# Patient Record
Sex: Male | Born: 1970 | Race: White | Hispanic: No | Marital: Single | Smoking: Current every day smoker
Health system: Southern US, Community
[De-identification: ages and names within clinical notes are randomized; demographics above are authoritative.]

## PROBLEM LIST (undated history)

## (undated) DIAGNOSIS — F1010 Alcohol abuse, uncomplicated: Secondary | ICD-10-CM

## (undated) DIAGNOSIS — R011 Cardiac murmur, unspecified: Secondary | ICD-10-CM

## (undated) DIAGNOSIS — F410 Panic disorder [episodic paroxysmal anxiety] without agoraphobia: Secondary | ICD-10-CM

## (undated) DIAGNOSIS — I10 Essential (primary) hypertension: Secondary | ICD-10-CM

## (undated) DIAGNOSIS — D569 Thalassemia, unspecified: Secondary | ICD-10-CM

---

## 2014-03-14 ENCOUNTER — Emergency Department (HOSPITAL_COMMUNITY)
Admission: EM | Admit: 2014-03-14 | Discharge: 2014-03-14 | Discharge status: Against Medical Advice | Payer: Self-pay | Attending: Emergency Medicine | Admitting: Emergency Medicine

## 2014-03-14 DIAGNOSIS — F1010 Alcohol abuse, uncomplicated: Secondary | ICD-10-CM | POA: Insufficient documentation

## 2014-03-14 DIAGNOSIS — Z720 Tobacco use: Secondary | ICD-10-CM | POA: Insufficient documentation

## 2014-03-14 DIAGNOSIS — I10 Essential (primary) hypertension: Secondary | ICD-10-CM | POA: Insufficient documentation

## 2014-03-14 DIAGNOSIS — R011 Cardiac murmur, unspecified: Secondary | ICD-10-CM | POA: Insufficient documentation

## 2014-03-14 NOTE — ED Notes (Signed)
Mr. Arthur ReamerShreve and friend are leaving facility at this time. Stating they will return at another time

## 2014-03-14 NOTE — ED Notes (Signed)
Per EMS pt picked up from street with request for detox from etoh.

## 2014-03-15 ENCOUNTER — Encounter (HOSPITAL_COMMUNITY): Payer: Self-pay | Admitting: Emergency Medicine

## 2014-03-15 ENCOUNTER — Encounter (HOSPITAL_COMMUNITY): Payer: Self-pay | Admitting: *Deleted

## 2014-03-15 ENCOUNTER — Emergency Department (HOSPITAL_COMMUNITY)
Admission: EM | Admit: 2014-03-15 | Discharge: 2014-03-15 | Disposition: A | Discharge status: Other Acute Inpatient Hospital | Payer: Self-pay | Attending: Emergency Medicine | Admitting: Emergency Medicine

## 2014-03-15 ENCOUNTER — Inpatient Hospital Stay (HOSPITAL_COMMUNITY)
Admission: AD | Admit: 2014-03-15 | Discharge: 2014-03-19 | DRG: 897 | Disposition: A | Discharge status: Home / Self Care | Payer: Federal, State, Local not specified - Other | Source: Intra-hospital | Attending: Psychiatry | Admitting: Psychiatry

## 2014-03-15 DIAGNOSIS — D569 Thalassemia, unspecified: Secondary | ICD-10-CM | POA: Diagnosis present

## 2014-03-15 DIAGNOSIS — Z590 Homelessness: Secondary | ICD-10-CM

## 2014-03-15 DIAGNOSIS — F17210 Nicotine dependence, cigarettes, uncomplicated: Secondary | ICD-10-CM | POA: Diagnosis present

## 2014-03-15 DIAGNOSIS — F1010 Alcohol abuse, uncomplicated: Secondary | ICD-10-CM | POA: Diagnosis present

## 2014-03-15 DIAGNOSIS — F1290 Cannabis use, unspecified, uncomplicated: Secondary | ICD-10-CM | POA: Diagnosis present

## 2014-03-15 DIAGNOSIS — I10 Essential (primary) hypertension: Secondary | ICD-10-CM | POA: Diagnosis present

## 2014-03-15 DIAGNOSIS — F1020 Alcohol dependence, uncomplicated: Secondary | ICD-10-CM

## 2014-03-15 DIAGNOSIS — F10232 Alcohol dependence with withdrawal with perceptual disturbance: Secondary | ICD-10-CM

## 2014-03-15 DIAGNOSIS — F10932 Alcohol use, unspecified with withdrawal with perceptual disturbance: Secondary | ICD-10-CM

## 2014-03-15 DIAGNOSIS — Z720 Tobacco use: Secondary | ICD-10-CM | POA: Insufficient documentation

## 2014-03-15 DIAGNOSIS — G4700 Insomnia, unspecified: Secondary | ICD-10-CM | POA: Diagnosis present

## 2014-03-15 DIAGNOSIS — Z9114 Patient's other noncompliance with medication regimen: Secondary | ICD-10-CM | POA: Diagnosis present

## 2014-03-15 DIAGNOSIS — F10239 Alcohol dependence with withdrawal, unspecified: Secondary | ICD-10-CM | POA: Diagnosis present

## 2014-03-15 DIAGNOSIS — F4001 Agoraphobia with panic disorder: Secondary | ICD-10-CM | POA: Diagnosis present

## 2014-03-15 DIAGNOSIS — F10929 Alcohol use, unspecified with intoxication, unspecified: Secondary | ICD-10-CM

## 2014-03-15 DIAGNOSIS — F19239 Other psychoactive substance dependence with withdrawal, unspecified: Secondary | ICD-10-CM

## 2014-03-15 DIAGNOSIS — F19980 Other psychoactive substance use, unspecified with psychoactive substance-induced anxiety disorder: Secondary | ICD-10-CM

## 2014-03-15 DIAGNOSIS — F10230 Alcohol dependence with withdrawal, uncomplicated: Secondary | ICD-10-CM

## 2014-03-15 DIAGNOSIS — F329 Major depressive disorder, single episode, unspecified: Secondary | ICD-10-CM | POA: Insufficient documentation

## 2014-03-15 DIAGNOSIS — F19280 Other psychoactive substance dependence with psychoactive substance-induced anxiety disorder: Secondary | ICD-10-CM

## 2014-03-15 DIAGNOSIS — F10220 Alcohol dependence with intoxication, uncomplicated: Secondary | ICD-10-CM | POA: Insufficient documentation

## 2014-03-15 HISTORY — DX: Cardiac murmur, unspecified: R01.1

## 2014-03-15 HISTORY — DX: Thalassemia, unspecified: D56.9

## 2014-03-15 HISTORY — DX: Panic disorder (episodic paroxysmal anxiety): F41.0

## 2014-03-15 HISTORY — DX: Essential (primary) hypertension: I10

## 2014-03-15 HISTORY — DX: Alcohol abuse, uncomplicated: F10.10

## 2014-03-15 LAB — CBC WITH DIFFERENTIAL/PLATELET
BASOS PCT: 1 % (ref 0–1)
Basophils Absolute: 0.1 10*3/uL (ref 0.0–0.1)
EOS PCT: 1 % (ref 0–5)
Eosinophils Absolute: 0.1 10*3/uL (ref 0.0–0.7)
HCT: 29.9 % — ABNORMAL LOW (ref 39.0–52.0)
HEMOGLOBIN: 10.1 g/dL — AB (ref 13.0–17.0)
Lymphocytes Relative: 34 % (ref 12–46)
Lymphs Abs: 2.6 10*3/uL (ref 0.7–4.0)
MCH: 23.3 pg — ABNORMAL LOW (ref 26.0–34.0)
MCHC: 33.8 g/dL (ref 30.0–36.0)
MCV: 68.9 fL — ABNORMAL LOW (ref 78.0–100.0)
MONOS PCT: 9 % (ref 3–12)
Monocytes Absolute: 0.7 10*3/uL (ref 0.1–1.0)
NEUTROS PCT: 55 % (ref 43–77)
Neutro Abs: 4.1 10*3/uL (ref 1.7–7.7)
Platelets: 148 10*3/uL — ABNORMAL LOW (ref 150–400)
RBC: 4.34 MIL/uL (ref 4.22–5.81)
RDW: 16.9 % — ABNORMAL HIGH (ref 11.5–15.5)
SMEAR REVIEW: ADEQUATE
WBC: 7.6 10*3/uL (ref 4.0–10.5)

## 2014-03-15 LAB — RAPID URINE DRUG SCREEN, HOSP PERFORMED
Amphetamines: NOT DETECTED
BENZODIAZEPINES: NOT DETECTED
Barbiturates: NOT DETECTED
COCAINE: NOT DETECTED
OPIATES: NOT DETECTED
Tetrahydrocannabinol: POSITIVE — AB

## 2014-03-15 LAB — COMPREHENSIVE METABOLIC PANEL
ALBUMIN: 4.1 g/dL (ref 3.5–5.2)
ALT: 34 U/L (ref 0–53)
ALT: 35 U/L (ref 0–53)
ANION GAP: 18 — AB (ref 5–15)
AST: 66 U/L — ABNORMAL HIGH (ref 0–37)
AST: 62 U/L — AB (ref 0–37)
Albumin: 4.4 g/dL (ref 3.5–5.2)
Alkaline Phosphatase: 73 U/L (ref 39–117)
Alkaline Phosphatase: 80 U/L (ref 39–117)
Anion gap: 13 (ref 5–15)
BILIRUBIN TOTAL: 2.6 mg/dL — AB (ref 0.3–1.2)
BUN: 5 mg/dL — ABNORMAL LOW (ref 6–23)
BUN: 5 mg/dL — ABNORMAL LOW (ref 6–23)
CALCIUM: 9.8 mg/dL (ref 8.4–10.5)
CO2: 23 mEq/L (ref 19–32)
CO2: 27 mEq/L (ref 19–32)
CREATININE: 0.59 mg/dL (ref 0.50–1.35)
CREATININE: 0.59 mg/dL (ref 0.50–1.35)
Calcium: 8.7 mg/dL (ref 8.4–10.5)
Chloride: 100 mEq/L (ref 96–112)
Chloride: 97 mEq/L (ref 96–112)
GFR calc Af Amer: >90 mL/min (ref >90)
GFR calc Af Amer: >90 mL/min (ref >90)
GFR calc non Af Amer: >90 mL/min (ref >90)
GFR calc non Af Amer: >90 mL/min (ref >90)
Glucose, Bld: 94 mg/dL (ref 70–99)
Glucose, Bld: 107 mg/dL — ABNORMAL HIGH (ref 70–99)
Potassium: 3.7 mEq/L (ref 3.7–5.3)
Potassium: 4.3 mEq/L (ref 3.7–5.3)
Sodium: 141 mEq/L (ref 137–147)
Sodium: 137 mEq/L (ref 137–147)
TOTAL PROTEIN: 7.5 g/dL (ref 6.0–8.3)
Total Bilirubin: 1.3 mg/dL — ABNORMAL HIGH (ref 0.3–1.2)
Total Protein: 8 g/dL (ref 6.0–8.3)

## 2014-03-15 LAB — ETHANOL: Alcohol, Ethyl (B): 334 mg/dL — ABNORMAL HIGH (ref 0–11)

## 2014-03-15 MED ORDER — ALUM & MAG HYDROXIDE-SIMETH 200-200-20 MG/5ML PO SUSP: 30.0000 mL | ORAL | Status: DC | PRN

## 2014-03-15 MED ORDER — LORAZEPAM 1 MG PO TABS
1.0000 mg | ORAL_TABLET | Freq: Four times a day (QID) | ORAL | Status: AC
  Administered 2014-03-15 – 2014-03-16 (×4): 1 mg via ORAL
  Filled 2014-03-15 (×4): qty 1

## 2014-03-15 MED ORDER — LORAZEPAM 1 MG PO TABS
1.0000 mg | ORAL_TABLET | Freq: Two times a day (BID) | ORAL | Status: AC
  Administered 2014-03-17 – 2014-03-18 (×2): 1 mg via ORAL
  Filled 2014-03-15 (×2): qty 1

## 2014-03-15 MED ORDER — IBUPROFEN 400 MG PO TABS: 600.0000 mg | ORAL_TABLET | Freq: Three times a day (TID) | ORAL | Status: DC | PRN

## 2014-03-15 MED ORDER — VITAMIN B-1 100 MG PO TABS
100.0000 mg | ORAL_TABLET | Freq: Every day | ORAL | Status: DC
  Administered 2014-03-15: 100 mg via ORAL
  Filled 2014-03-15: qty 1

## 2014-03-15 MED ORDER — ADULT MULTIVITAMIN W/MINERALS CH
1.0000 | ORAL_TABLET | Freq: Every day | ORAL | Status: DC
  Administered 2014-03-15 – 2014-03-19 (×5): 1 via ORAL
  Filled 2014-03-15 (×9): qty 1

## 2014-03-15 MED ORDER — FOLIC ACID 1 MG PO TABS
1.0000 mg | ORAL_TABLET | Freq: Every day | ORAL | Status: DC
  Administered 2014-03-15 – 2014-03-19 (×5): 1 mg via ORAL
  Filled 2014-03-15 (×8): qty 1

## 2014-03-15 MED ORDER — LORAZEPAM 1 MG PO TABS
1.0000 mg | ORAL_TABLET | Freq: Four times a day (QID) | ORAL | Status: AC | PRN
  Administered 2014-03-16 – 2014-03-17 (×2): 1 mg via ORAL
  Filled 2014-03-15 (×2): qty 1

## 2014-03-15 MED ORDER — LORAZEPAM 1 MG PO TABS: 0.0000 mg | ORAL_TABLET | Freq: Four times a day (QID) | ORAL | Status: DC

## 2014-03-15 MED ORDER — ONDANSETRON HCL 4 MG PO TABS
4.0000 mg | ORAL_TABLET | Freq: Three times a day (TID) | ORAL | Status: DC | PRN
  Administered 2014-03-15: 4 mg via ORAL
  Filled 2014-03-15: qty 1

## 2014-03-15 MED ORDER — ACETAMINOPHEN 325 MG PO TABS: 650.0000 mg | ORAL_TABLET | ORAL | Status: DC | PRN

## 2014-03-15 MED ORDER — NICOTINE 21 MG/24HR TD PT24
21.0000 mg | MEDICATED_PATCH | Freq: Every day | TRANSDERMAL | Status: DC
  Administered 2014-03-15: 21 mg via TRANSDERMAL
  Filled 2014-03-15: qty 1

## 2014-03-15 MED ORDER — MAGNESIUM HYDROXIDE 400 MG/5ML PO SUSP: 30.0000 mL | Freq: Every day | ORAL | Status: DC | PRN

## 2014-03-15 MED ORDER — THIAMINE HCL 100 MG/ML IJ SOLN
100.0000 mg | Freq: Once | INTRAMUSCULAR | Status: AC
  Administered 2014-03-15: 100 mg via INTRAMUSCULAR
  Filled 2014-03-15: qty 2

## 2014-03-15 MED ORDER — TRAZODONE HCL 50 MG PO TABS
50.0000 mg | ORAL_TABLET | Freq: Every evening | ORAL | Status: DC | PRN
  Administered 2014-03-16 – 2014-03-18 (×3): 50 mg via ORAL
  Filled 2014-03-15: qty 14
  Filled 2014-03-15 (×2): qty 1

## 2014-03-15 MED ORDER — LOPERAMIDE HCL 2 MG PO CAPS: 2.0000 mg | ORAL_CAPSULE | ORAL | Status: AC | PRN

## 2014-03-15 MED ORDER — THIAMINE HCL 100 MG/ML IJ SOLN: 100.0000 mg | Freq: Every day | INTRAMUSCULAR | Status: DC

## 2014-03-15 MED ORDER — NICOTINE 21 MG/24HR TD PT24
21.0000 mg | MEDICATED_PATCH | Freq: Every day | TRANSDERMAL | Status: DC
  Administered 2014-03-16 – 2014-03-19 (×4): 21 mg via TRANSDERMAL
  Filled 2014-03-15 (×7): qty 1

## 2014-03-15 MED ORDER — HYDROXYZINE HCL 25 MG PO TABS
25.0000 mg | ORAL_TABLET | Freq: Four times a day (QID) | ORAL | Status: AC | PRN
  Administered 2014-03-15: 25 mg via ORAL
  Filled 2014-03-15: qty 1

## 2014-03-15 MED ORDER — DICLOFENAC SODIUM 1 % TD GEL
4.0000 g | Freq: Four times a day (QID) | TRANSDERMAL | Status: DC
  Administered 2014-03-15 – 2014-03-18 (×8): 4 g via TOPICAL
  Filled 2014-03-15: qty 100

## 2014-03-15 MED ORDER — ENSURE COMPLETE PO LIQD
237.0000 mL | Freq: Two times a day (BID) | ORAL | Status: DC
  Administered 2014-03-16 – 2014-03-19 (×6): 237 mL via ORAL

## 2014-03-15 MED ORDER — SERTRALINE HCL 25 MG PO TABS
25.0000 mg | ORAL_TABLET | Freq: Every day | ORAL | Status: DC
  Administered 2014-03-15 – 2014-03-16 (×2): 25 mg via ORAL
  Filled 2014-03-15 (×5): qty 1

## 2014-03-15 MED ORDER — LORAZEPAM 1 MG PO TABS
1.0000 mg | ORAL_TABLET | Freq: Three times a day (TID) | ORAL | Status: AC
  Administered 2014-03-16 – 2014-03-17 (×3): 1 mg via ORAL
  Filled 2014-03-15 (×3): qty 1

## 2014-03-15 MED ORDER — LORAZEPAM 1 MG PO TABS: 0.0000 mg | ORAL_TABLET | Freq: Two times a day (BID) | ORAL | Status: DC

## 2014-03-15 MED ORDER — VITAMIN B-1 100 MG PO TABS
100.0000 mg | ORAL_TABLET | Freq: Every day | ORAL | Status: DC
  Administered 2014-03-16 – 2014-03-19 (×4): 100 mg via ORAL
  Filled 2014-03-15 (×7): qty 1

## 2014-03-15 MED ORDER — LORAZEPAM 1 MG PO TABS
1.0000 mg | ORAL_TABLET | Freq: Once | ORAL | Status: AC
  Administered 2014-03-15: 1 mg via ORAL
  Filled 2014-03-15: qty 1

## 2014-03-15 MED ORDER — LORAZEPAM 1 MG PO TABS
1.0000 mg | ORAL_TABLET | Freq: Every day | ORAL | Status: AC
  Administered 2014-03-19: 1 mg via ORAL
  Filled 2014-03-15: qty 1

## 2014-03-15 MED ORDER — ACETAMINOPHEN 325 MG PO TABS: 650.0000 mg | ORAL_TABLET | Freq: Four times a day (QID) | ORAL | Status: DC | PRN

## 2014-03-15 NOTE — [facility] Group Notes (Signed)
[facility] Group Notes:  (Counselor/Nursing/MHT/Case Management/Adjunct)  03/15/2014 1:15PM  Type of Therapy:  Group Therapy  Participation Level:  Did not attend  Summary of Progress/Problems: The topic for group was balance in life.  Pt participated in the discussion about when their life was in balance and out of balance and how this feels.  Pt discussed ways to get back in balance and short term goals they can work on to get where they want to be.    Daryel Geraldorth, Shary Lamos B 03/15/2014 2:11 PM

## 2014-03-15 NOTE — ED Notes (Signed)
Severe panic attacks.  Requesting detox from ETOH  "I have been drinking for 30 years and need help"
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
## 2014-03-15 NOTE — Progress Notes (Signed)
Psychoeducational Group Note  Date:  03/15/2014 Time:  2100  Group Topic/Focus:  wrap up group  Participation Level: Did Not Attend  Participation Quality:  Not Applicable  Affect:  Not Applicable  Cognitive:  Not Applicable  Insight:  Not Applicable  Engagement in Group: Not Applicable  Additional Comments: Pt was notified that group was beginning but remained in his bed sleeping. Pt was a new admit today.   Shelah LewandowskySquires, Donyell Carrell Carol 03/15/2014, 11:26 PM
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
## 2014-03-15 NOTE — ED Notes (Signed)
Telepsych in process 

## 2014-03-15 NOTE — ED Provider Notes (Signed)
CSN: [id number]     Arrival date & time 03/15/14  0008 History   First MD Initiated Contact with Patient 03/15/14 0139     Chief Complaint  Patient presents with  . Detox from ETOH      (Consider location/radiation/quality/duration/timing/severity/associated sxs/prior Treatment) HPI 43 year old male presents to emergency requesting alcohol detox.  He reports it has been 5 years since his last.  Sobriety.  Patient reports that when he does not drink alcohol, he had 3 shaky.  He reports history of DTs in the past.  Patient reports he drinks a fifth of liquor and as much beer as he and his hands on.  Patient reports history of PTSD, schizophrenia, depression.  He is not currently on any psychotropic medications.  He does not have a psychiatrist or therapist.  Patient denies any other drug abuse.  Patient smokes about a pack a day.  Patient is currently homeless.  No prior visits in our hospital system. Past Medical History  Diagnosis Date  . ETOH abuse   . Panic attack    History reviewed. No pertinent past surgical history. No family history on file. History  Substance Use Topics  . Smoking status: Current Every Day Smoker  . Smokeless tobacco: Never Used  . Alcohol Use: Yes    Review of Systems  Level V caveat patient is intoxicated, and is having difficulty answering questions  Allergies  Review of patient's allergies indicates no known allergies.  Home Medications   Prior to Admission medications   Not on File   BP 120/65  Pulse 95  Temp(Src) 98.6 F (37 C) (Oral)  Resp 16  Ht 5\' 11"  (1.803 m)  Wt 145 lb (65.772 kg)  BMI 20.23 kg/m2  SpO2 98% Physical Exam  Nursing note and vitals reviewed. Constitutional: He is oriented to person, place, and time. He appears well-developed and well-nourished.  HENT:  Head: Normocephalic and atraumatic.  Nose: Nose normal.  Mouth/Throat: Oropharynx is clear and moist.  Eyes: Conjunctivae and EOM are normal. Pupils are equal,  round, and reactive to light.  Neck: Normal range of motion. Neck supple. No JVD present. No tracheal deviation present. No thyromegaly present.  Cardiovascular: Normal rate, regular rhythm, normal heart sounds and intact distal pulses.  Exam reveals no gallop and no friction rub.   No murmur heard. Pulmonary/Chest: Effort normal and breath sounds normal. No stridor. No respiratory distress. He has no wheezes. He has no rales. He exhibits no tenderness.  Abdominal: Soft. Bowel sounds are normal. He exhibits no distension and no mass. There is no tenderness. There is no rebound and no guarding.  Musculoskeletal: Normal range of motion. He exhibits no edema and no tenderness.  Lymphadenopathy:    He has no cervical adenopathy.  Neurological: He is alert and oriented to person, place, and time. He displays normal reflexes. He exhibits normal muscle tone. Coordination normal.  Skin: Skin is warm and dry. No rash noted. No erythema. No pallor.  Psychiatric: His behavior is normal. Judgment and thought content normal.  Easily startled, reports depressed mood    ED Course  Procedures (including critical care time) Labs Review Labs Reviewed  CBC WITH DIFFERENTIAL - Abnormal; Notable for the following:    Hemoglobin 10.1 (*)    HCT 29.9 (*)    MCV 68.9 (*)    MCH 23.3 (*)    RDW 16.9 (*)    Platelets 148 (*)    All other components within normal limits  COMPREHENSIVE METABOLIC  PANEL  URINE RAPID DRUG SCREEN (HOSP PERFORMED)  ETHANOL    Imaging Review No results found.   EKG Interpretation None      MDM   Final diagnoses:  Alcohol dependence with uncomplicated intoxication   43 yo male with alcohol dependence, prior history of severe alcohol withdrawal.  Patient also reports history of psychiatric diagnosis as well.  We'll get labs, treat for any withdrawal symptoms.  We'll get TTS consult for possible placement.  5:27 AM Labs not crossing over:  ETOH 334 Na 141, K 3.7, Cl 100,  CO2 23, Glu 94, Bun 5, Cr 0.59, Ca 8.7, Total Protein 7.5, Albumin 4.1, AST 66, ALT 34, AP 73, Bili 1.3  Pt is medically clear, but clearly intoxicated.  Would not expect withdrawal symptoms for a few hours.  Olivia Mackielga M Roshon Duell, MD 03/15/14 0530

## 2014-03-15 NOTE — ED Notes (Signed)
[name] TO GO [facility] AND SEND PATIENT OVER

## 2014-03-15 NOTE — Progress Notes (Signed)
performed by [facility]

## 2014-03-15 NOTE — BH Assessment (Signed)
Requested TA equipment be placed with pt for assessment.   Attempted to speak with EDP prior to assessment, and she was currently unavailable. In order to avoid delay in pt care initiating assessment and will follow up with EDP following.    Assessment to commence shortly.   Arthur BernhardtNancy Charleton Deyoung, [facility] Triage Specialist 03/15/2014 3:35 AM

## 2014-03-15 NOTE — ED Notes (Signed)
[facility] HAS ARRIVED TO TRANSPORT [facility]

## 2014-03-15 NOTE — BH Assessment (Signed)
Relayed results of assessment to Donell SievertSpencer Simon, PA. Per Karleen HampshireSpencer PA, pt meets inpt criteria and can be accepted to a 500 hall bed, pt must wait until after 11 am to arrive due to BAL.   Spoke with Bunnie Pionori AC to get bed assignment. Pt will be going to a 500 hall bed ([facility] under the care of Dr. Elna BreslowEappen. Pt to arrive after 11 am report can be called to [phone].   In[facility] Norlene Campbelltter [facility]. She is in agreement with plan.     Informed [facility] She will obtain support paperwork, fax to [phone] and send originals with pt when transported via [facility].   Informed registration tech of pt acceptance.   Clista BernhardtNancy Kathern Lobosco, [facility] Triage Specialist 03/15/2014 6:11 AM

## 2014-03-15 NOTE — BHH Suicide Risk Assessment (Signed)
   Nursing information obtained from:    Demographic factors:    Current Mental Status:    Loss Factors:    Historical Factors:    Risk Reduction Factors:    Total Time spent with patient: 45 minutes  CLINICAL FACTORS:   Alcohol/Substance Abuse/Dependencies  Psychiatric Specialty Exam: Physical Exam  ROS  Blood pressure 128/66, pulse 77.There is no weight on file to calculate BMI.  Please see H&P for MSE.    SUICIDE RISK:   Mild:  Suicidal ideation of limited frequency, intensity, duration, and specificity.  There are no identifiable plans, no associated intent, mild dysphoria and related symptoms, good self-control (both objective and subjective assessment), few other risk factors, and identifiable protective factors, including available and accessible social support.  PLAN OF CARE:Please see H&P.   I certify that inpatient services furnished can reasonably be expected to improve the patient's condition.  Jawara Latorre md 03/15/2014, 2:08 PM

## 2014-03-15 NOTE — ED Notes (Signed)
[facility] CALLED TO TRANSPORT PATIENT TO [facility] 

## 2014-03-15 NOTE — Progress Notes (Signed)
performed by [facility] 

## 2014-03-16 ENCOUNTER — Other Ambulatory Visit: Payer: Self-pay

## 2014-03-16 DIAGNOSIS — F10239 Alcohol dependence with withdrawal, unspecified: Principal | ICD-10-CM

## 2014-03-16 LAB — TSH: TSH: 1.16 u[IU]/mL (ref 0.350–4.500)

## 2014-03-16 MED ORDER — ESCITALOPRAM OXALATE 10 MG PO TABS
10.0000 mg | ORAL_TABLET | Freq: Every day | ORAL | Status: DC
  Administered 2014-03-17 – 2014-03-19 (×3): 10 mg via ORAL
  Filled 2014-03-16 (×2): qty 1
  Filled 2014-03-16: qty 14
  Filled 2014-03-16 (×3): qty 1

## 2014-03-16 MED ORDER — ONDANSETRON HCL 4 MG PO TABS
4.0000 mg | ORAL_TABLET | Freq: Three times a day (TID) | ORAL | Status: DC | PRN
  Administered 2014-03-18: 4 mg via ORAL
  Filled 2014-03-16: qty 1

## 2014-03-16 NOTE — Progress Notes (Signed)
D. Pt has been in his room this evening, minimal participation in milieu. Pt is tremulous and actively withdrawing. Pt has endorsed anxiety and sweats. Pt did receive medication as well as fluids to help with hydration.  A. Support and encouragement provided. R. Safety maintained, will continue to monitor.

## 2014-03-16 NOTE — Progress Notes (Signed)
NUTRITION ASSESSMENT NOTE  RD consult received for nutrition education  INTERVENTION: 1. Educated patient on the importance of nutrition and encouraged intake of food and beverages. 2. Discussed weight goals. 3. Supplements: Ensure Complete po BID, each supplement provides 350 kcal and 13 grams of protein   NUTRITION DIAGNOSIS: Inadequate oral intake related to alcohol withdrawal as evidenced by pt report   Goal: Pt to meet >/= 90% of their estimated nutrition needs.  Monitor:  PO intake  Assessment:  43 year old man , who reports a long and protracted history of alcohol dependence. He has been drinking every day, " for a long time". He states he drinks beer , sometimes liquor, and " all day long". He can drink up to a case of beer per day.  During visit pt reports feeling nauseous. He is unable to provide much history at this time and is not very receptive to education.  Pt did state that his appetite was fine PTA but since withdrawal he has no appetite.  Pt is at nutritional risk d/t Hx of alcohol abuse and living situation (homeless).   Continue to provide Ensure supplement to provide extra calories and protein for help in recovery.    Labs reviewed: Low BUN  Height: Ht Readings from Last 1 Encounters:  03/15/14 5' 8.9" (1.75 m)    Weight: Wt Readings from Last 1 Encounters:  03/15/14 141 lb (63.957 kg)    Weight Hx: Wt Readings from Last 10 Encounters:  03/15/14 141 lb (63.957 kg)  03/15/14 145 lb (65.772 kg)    BMI:  Body mass index is 20.88 kg/(m^2). Pt meets criteria for normal range based on current BMI.  Estimated Nutritional Needs: Kcal: 30-35 kcal/kg Protein: > 1 gram protein/kg Fluid: 1 ml/kcal  Diet Order: General Pt is also offered choice of unit snacks mid-morning and mid-afternoon.  Pt is eating as desired.   Lab results and medications reviewed.   Tilda FrancoLindsey Jerico Grisso, MS, RD, LDN Pager: [phone] After Hours Pager: [phone]

## 2014-03-16 NOTE — Progress Notes (Signed)
D) Pt has been lying in the bed much of the shift stating "I don't feel worth anything". States, "I am detoxing and I don't want to do anything". Affect is flat and mood depressed. Pt. States that he always has a rough time with his detox. Did ask for towels, and items for bathing. All meals brought to Pt along with pushing fluids. Pt rates his depression at a 9, hopelessness at an 8 and his anxiety at a 10. Denies SI and HI. A) Given support, reassurance and praise. Provided with a 1:1. Provided with fluids throughout the shift to hydrate Pt. Provided with toiletries. R) Denies SI and HI.

## 2014-03-16 NOTE — BHH Suicide Risk Assessment (Signed)
   Nursing information obtained from:    Demographic factors:   43 year old single male Current Mental Status:   See below Loss Factors:   homelessness, poor social support system Historical Factors:   long history of alcohol dependence Risk Reduction Factors:   resilience  Total Time spent with patient: 45 minutes  CLINICAL FACTORS:  Alcohol dependence, panic disorder  Psychiatric Specialty Exam: Physical Exam  ROS  Blood pressure 124/80, pulse 95, temperature 97.6 F (36.4 C), temperature source Oral, resp. rate 16, height 5' 8.9" (1.75 m), weight 63.957 kg (141 lb).Body mass index is 20.88 kg/(m^2).  SEE ADMIT NOTE MSE    COGNITIVE FEATURES THAT CONTRIBUTE TO RISK:  Closed-mindedness    SUICIDE RISK:   Moderate:  Frequent suicidal ideation with limited intensity, and duration, some specificity in terms of plans, no associated intent, good self-control, limited dysphoria/symptomatology, some risk factors present, and identifiable protective factors, including available and accessible social support.  PLAN OF CARE: Patient will be admitted to inpatient psychiatric unit for stabilization and safety. Will provide and encourage milieu participation. Provide medication management and maked adjustments as needed.   Will also provide medication management to minimize risk of severe ETOH withdrawal. Will follow daily.    I certify that inpatient services furnished can reasonably be expected to improve the patient's condition.  COBOS, FERNANDO 03/16/2014, 12:12 PM

## 2014-03-16 NOTE — Clinical Social Work Note (Addendum)
CSW attempted to meet with patient to complete PSA.  Patient advised of not feeling well and asked CSW to come back later.  CSW attempted to meet with patient again to complete PSA.  He got out of bed but stated he did not feel up to answering a lot of questions.

## 2014-03-16 NOTE — BHH Group Notes (Signed)
Adult Psychoeducational Group Note  Date:  03/16/2014 Time:  11:16 PM  Group Topic/Focus:  AA Meeting  Participation Level:  Did Not Attend  Participation Quality:  None  Affect:  None  Cognitive:  None  Insight: None  Engagement in Group:  None  Modes of Intervention:  Discussion and Education  Additional Comments:  Arthur GermanyJohn [name] did not attend group.  Arthur RancherLindsay, Arthur [name] A 03/16/2014, 11:16 PM

## 2014-03-16 NOTE — [facility] Group Notes (Signed)
[facility] Note   03/16/2014 11:57 AM  Participation Quality:  Did not attend group.  Jennette Leask, Joesph JulyQuylle Hairston

## 2014-03-16 NOTE — Tx Team (Signed)
Interdisciplinary Treatment Plan Update   Date Reviewed:  03/16/2014  Time Reviewed:  9:47 AM  Progress in Treatment:   Attending groups: Yes Participating in groups: Yes Taking medication as prescribed: Yes  Tolerating medication: Yes Family/Significant other contact made:  No, but will ask patient for consent for collateral contact Patient understands diagnosis: Yes, patient understands diagnosis and need for treatment. Discussing patient identified problems/goals with staff: Yes, patient is able to express goals for treatment and discharge. Medical problems stabilized or resolved: Yes Denies suicidal/homicidal ideation: Yes Patient has not harmed self or others: Yes  For review of initial/current patient goals, please see plan of care.  Estimated Length of Stay:  3-5 days  Reasons for Continued Hospitalization:  Anxiety Depression Medication stabilization  New Problems/Goals identified:    Discharge Plan or Barriers:   Home with outpatient follow up to be determined  Additional Comments:   Arthur [name] is an 43 y.o. Caucasian male who is divorced ,unemployed and [place] presented to ED requesting assistance with his drinking and self-reported panic disorder. Pt reports he has been off of his medications for more than 6 years. Pt reports he has started on Lexapro sometime in the past when [place] in [place] and this was for depression and anxiety sx. Pt also reports hearing voices since a long time ,unsure if this is related to drinking ,since he drinks all the time and very heavily. Pt reports the voices as "music and whispers."  Pt reports anxiety issues since the age of 89 y. He reports having a heart condition and having a "murmur" and hence had palpitations all the time which made him panic. Pt started drinking at the age of 43 y.  Pt drinks regularly ,a 5 th of vodka and 5-6 beers everyday.    Patient and CSW reviewed  patient's identified goals and treatment plan.  Patient verbalized understanding and agreed to treatment plan.   Attendees:  Patient:  03/16/2014 9:47 AM   Signature:  Sallyanne HaversF. Cobos, MD 03/16/2014 9:47 AM  Signature: Geoffery LyonsIrving Lugo, MD 03/16/2014 9:47 AM  Signature:  Genelle GatherPatti Dukes, RN 03/16/2014 9:47 AM  Signature:  Robbie LouisVivian Kent, RN 03/16/2014 9:47 AM  Signature:  Lamount Crankerhris Judge, RN 03/16/2014 9:47 AM  Signature:  Juline PatchQuylle Dushaun Okey, LCSW 03/16/2014 9:47 AM  Signature:  Belenda CruiseKristin Drinkard, LCSW-A 03/16/2014 9:47 AM  Signature:  Leisa LenzValerie Enoch, Care Coordinator [facility] 03/16/2014 9:47 AM  Signature:  03/16/2014 9:47 AM  Signature: 03/16/2014  9:47 AM  Signature:   , RN URCM 03/16/2014  9:47 AM  Signature:  Santa GeneraAnne Cunningham Lead Social Worker LCSW 03/16/2014  9:47 AM    Scribe for Treatment Team:   [place]hes[place] EnergyQuylle Jayven Naill,  03/16/2014 9:47 AM

## 2014-03-16 NOTE — B[facility] Group Notes (Signed)
[facility] LCSW Group Therapy  Feelings Around Relapse 1:15 -2:30        03/16/2014 3:17 PM   Type of Therapy:  Group Therapy  Participation Level:  Did not attend group - patient in bed.   Wynn BankerHodnett, Estera Ozier Hairston 03/16/2014 3:17 PM
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
## 2014-03-16 NOTE — Progress Notes (Signed)
[facility] Group Notes:  (Nursing/MHT/Case Management/Adjunct)  Date:  03/16/2014  Time:  1:05 PM  Type of Therapy:  Therapeutic Activity  Participation Level:  Did not attend. Pt was in bed asleep.   Arthur [name] 03/16/2014, 1:05 PM 

## 2014-03-17 DIAGNOSIS — F1020 Alcohol dependence, uncomplicated: Secondary | ICD-10-CM

## 2014-03-17 DIAGNOSIS — F419 Anxiety disorder, unspecified: Secondary | ICD-10-CM

## 2014-03-17 DIAGNOSIS — F323 Major depressive disorder, single episode, severe with psychotic features: Secondary | ICD-10-CM

## 2014-03-17 NOTE — Progress Notes (Signed)
D. Pt has been in room for the evening, unable to tolerate activities. Pt reports having a difficult time with his detox and has complained of nausea, shakes and sweats. Pt does appear to still be in active withdrawal, is tremulous and withdrawn. Pt did receive medications without incident and was provided with a sandwich as he spoke about how he wanted to try to eat something, which he was able to do. A. Support and encouragement provided. R. Safety maintained, will continue to monitor.

## 2014-03-17 NOTE — [facility] Group Notes (Signed)
[facility] Group Notes:  (Nursing/MHT/Case Management/Adjunct)  Date:  03/17/2014  Time:  1:56 PM  Type of Therapy:  Psychoeducational Skills  Participation Level:  Did Not Attend  Arthur [name] 03/17/2014, 1:56 PM 
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
## 2014-03-17 NOTE — [facility] Group Notes (Signed)
[facility] Group Notes: (Clinical Social Work)   03/17/2014      Type of Therapy:  Group Therapy   Participation Level:  Did Not Attend - was in bed   Ambrose MantleMareida Grossman-Orr, LCSW 03/17/2014, 1:46 PM

## 2014-03-17 NOTE — Progress Notes (Signed)
Patient did attend the evening speaker AA meeting.  

## 2014-03-17 NOTE — Progress Notes (Signed)
D Arthur GermanyJohn [name] remains generally quite uncomfortable, choosing to saty in his bed..influenza his room all day.   A He does takes his medications as scheduuled and he completed his morning assessment ( with significant help from nursing). He rates his depression, hopelessness and anxiety "10/ 10/10" and denied SI within the past 24 hrs.   R Safety is in place.

## 2014-03-17 NOTE — Progress Notes (Signed)
[facility] MD Progress Note  03/17/2014 12:07 PM Arthur [name]  MRN:  [id number] Subjective:  Patient was seen in his room sleeping.  Has not been actively participating in groups.  Patient states that he came in this time to seek treatment for his mental illness.  He admitted to using alcohol to treat his mental illness, admits to being off his MH medications for up to 5 years.  Patient reports several detox treatment but ends up relapsing.  Patient states he is homeless and have [place] the woods for [place].   He feels hopeless and helpless.  Patient reports that he drinks to treat his anxiety and depression.  He states that he does not know where he will be going as soon as he is discharged.  Never had an outpatient provider in 6 years.   He denies SI/HI but reported auditory hallucinations.  He admits to hearing voices but cannot make out what the voices are saying.  He also reported poor appetite and did not join other patient's for lunch.  We will continue to monitor patient, provide safety and offer his medications.  Discharge planning is in progress.  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, with panic attacks, R/O PTSD, R/O Panic Disorder  303.90 Alcohol Use Disorder, Severe  296.23 Major Depressive Disorder, Severe with psychotic features R/O Substance induced Depressive Disorder, R/O Psychotic Disorder  DSM5: Schizophrenia Disorders:   Obsessive-Compulsive Disorders:   Trauma-Stressor Disorders:  Posttraumatic Stress Disorder (309.81) Substance/Addictive Disorders:  Alcohol Related Disorder - Severe (303.90), Alcohol use disorder, severe Depressive Disorders:  Major Depressive Disorder - Severe (296.23) with Psychotic features Total Time spent with patient: 30 minutes   ADL's:  Intact  Sleep: Good  Appetite:  Poor  Suicidal Ideation:  Denies Homicidal Ideation:  Denies  Psychiatric Specialty Exam: Physical Exam  ROS  Blood pressure 125/72, pulse 97, temperature  98.4 F (36.9 C), temperature source Oral, resp. rate 19, height 5' 8.9" (1.75 m), weight 63.957 kg (141 lb).Body mass index is 20.88 kg/(m^2).  General Appearance: Casual  Eye Contact::  Poor  Speech:  Clear and Coherent and Normal Rate  Volume:  Normal  Mood:  Depressed, Hopeless, Worthless and Helpless  Affect:  Congruent, Depressed and Flat  Thought Process:  Coherent  Orientation:  Full (Time, Place, and Person)  Thought Content:  WDL  Suicidal Thoughts:  No  Homicidal Thoughts:  No  Memory:  Immediate;   Good Recent;   Good Remote;   Fair  Judgement:  Impaired  Insight:  Fair  Psychomotor Activity:  Tremor  Concentration:  Fair  Recall:  NA  Fund of Knowledge:Good  Language: Good  Akathisia:  NA  Handed:  Right  AIMS (if indicated):     Assets:  Desire for Improvement Housing  Sleep:  Number of Hours: 6.5   Musculoskeletal: Strength & Muscle Tone: within normal limits Gait & Station: normal Patient leans: N/A  Current Medications: Current Facility-Administered Medications  Medication Dose Route Frequency Provider Last Rate Last Dose  . acetaminophen (TYLENOL) tablet 650 mg  650 mg Oral Q6H PRN Ursula Alert, MD      . diclofenac sodium (VOLTAREN) 1 % transdermal gel 4 g  4 g Topical QID Ursula Alert, MD   4 g at 03/17/14 1133  . escitalopram (LEXAPRO) tablet 10 mg  10 mg Oral Daily Neita Garnet, MD   10 mg at 03/17/14 0916  . feeding supplement (ENSURE COMPLETE) (ENSURE COMPLETE) liquid 237 mL  237  mL Oral BID BM Saramma Eappen, MD   237 mL at 71/16/57 9038  . folic acid (FOLVITE) tablet 1 mg  1 mg Oral Daily Saramma Eappen, MD   1 mg at 03/17/14 0915  . hydrOXYzine (ATARAX/VISTARIL) tablet 25 mg  25 mg Oral Q6H PRN Ursula Alert, MD   25 mg at 03/15/14 1525  . loperamide (IMODIUM) capsule 2-4 mg  2-4 mg Oral PRN Ursula Alert, MD      . LORazepam (ATIVAN) tablet 1 mg  1 mg Oral Q6H PRN Ursula Alert, MD   1 mg at 03/16/14 2149  . LORazepam (ATIVAN) tablet  1 mg  1 mg Oral BID Ursula Alert, MD       Followed by  . [START ON 03/19/2014] LORazepam (ATIVAN) tablet 1 mg  1 mg Oral Daily Saramma Eappen, MD      . magnesium hydroxide (MILK OF MAGNESIA) suspension 30 mL  30 mL Oral Daily PRN Ursula Alert, MD      . multivitamin with minerals tablet 1 tablet  1 tablet Oral Daily Ursula Alert, MD   1 tablet at 03/17/14 0920  . nicotine (NICODERM CQ - dosed in mg/24 hours) patch 21 mg  21 mg Transdermal Q0600 Ursula Alert, MD   21 mg at 03/17/14 0637  . ondansetron (ZOFRAN) tablet 4 mg  4 mg Oral Q8H PRN Janett Labella, NP      . thiamine (VITAMIN B-1) tablet 100 mg  100 mg Oral Daily Ursula Alert, MD   100 mg at 03/17/14 0916  . traZODone (DESYREL) tablet 50 mg  50 mg Oral QHS PRN Ursula Alert, MD   50 mg at 03/16/14 2149    Lab Results:  Results for orders placed during the hospital encounter of 03/15/14 (from the past 48 hour(s))  COMPREHENSIVE METABOLIC PANEL     Status: Abnormal   Collection Time    03/15/14  7:37 PM      Result Value Ref Range   Sodium 137  137 - 147 mEq/L   Potassium 4.3  3.7 - 5.3 mEq/L   Chloride 97  96 - 112 mEq/L   CO2 27  19 - 32 mEq/L   Glucose, Bld 107 (*) 70 - 99 mg/dL   BUN 5 (*) 6 - 23 mg/dL   Creatinine, Ser 0.59  0.50 - 1.35 mg/dL   Calcium 9.8  8.4 - 10.5 mg/dL   Total Protein 8.0  6.0 - 8.3 g/dL   Albumin 4.4  3.5 - 5.2 g/dL   AST 62 (*) 0 - 37 U/L   ALT 35  0 - 53 U/L   Alkaline Phosphatase 80  39 - 117 U/L   Total Bilirubin 2.6 (*) 0.3 - 1.2 mg/dL   GFR calc non Af Amer >90  >90 mL/min   GFR calc Af Amer >90  >90 mL/min   Comment: (NOTE)     The eGFR has been calculated using the CKD EPI equation.     This calculation has not been validated in all clinical situations.     eGFR's persistently <90 mL/min signify possible Chronic Kidney     Disease.   Anion gap 13  5 - 15   Comment: Performed at [facility]     Status: None   Collection Time    03/15/14   7:37 PM      Result Value Ref Range   Alcohol, Ethyl (B) <11  0 - 11 mg/dL  Comment:            LOWEST DETECTABLE LIMIT FOR     SERUM ALCOHOL IS 11 mg/dL     FOR MEDICAL PURPOSES ONLY     Performed at [facility]  TSH     Status: None   Collection Time    03/15/14  7:37 PM      Result Value Ref Range   TSH 1.160  0.350 - 4.500 uIU/mL   Comment: Performed at [facility]    Physical Findings: AIMS: Facial and Oral Movements Muscles of Facial Expression: None, normal Lips and Perioral Area: None, normal Jaw: None, normal Tongue: None, normal,Extremity Movements Upper (arms, wrists, hands, fingers): None, normal Lower (legs, knees, ankles, toes): None, normal, Trunk Movements Neck, shoulders, hips: None, normal, Overall Severity Severity of abnormal movements (highest score from questions above): None, normal Incapacitation due to abnormal movements: None, normal Patient's awareness of abnormal movements (rate only patient's report): No Awareness, Dental Status Current problems with teeth and/or dentures?: No Does patient usually wear dentures?: No  CIWA:  CIWA-Ar Total: 10 COWS:     Treatment Plan Summary: Daily contact with patient to assess and evaluate symptoms and progress in treatment Medication management  Plan:  Continue with plan of care Continue crisis management Encourage to participate in group and individual sessions Continue medication management/ and review as needed Continue alcohol detox with our Ativan protocol Escitalopram 10 mg by mouth daily for depression/anxiety Trazodone 50 mg by mouth at bed time for insomnia, may repeat x1  If needed Offer ensure supplements as ordered. Discharge  Plan in progress Address health issues /V/S as needed    Medical Decision Making Problem Points:  Established problem, stable/improving (1) Data Points:  Review and summation of old records (2) Review of medication regiment & side effects  (2)  I certify that inpatient services furnished can reasonably be expected to improve the patient's condition.   Charmaine Downs, C   PMHNP-BC 03/17/2014, 12:07 PM I agreed with findings and treatment plan of this patient

## 2014-03-18 MED ORDER — HYDROXYZINE HCL 25 MG PO TABS
25.0000 mg | ORAL_TABLET | Freq: Every evening | ORAL | Status: DC | PRN
  Administered 2014-03-18: 25 mg via ORAL
  Filled 2014-03-18: qty 14
  Filled 2014-03-18: qty 1

## 2014-03-18 NOTE — [facility] Group Notes (Signed)
[facility] Group Notes: (Clinical Social Work)   03/18/2014      Type of Therapy:  Group Therapy   Participation Level:  Did Not Attend - refused   Ambrose MantleMareida Grossman-Orr, LCSW 03/18/2014, 12:33 PM

## 2014-03-18 NOTE — Progress Notes (Signed)
D) Pt. got OOB briefly for AM meds then went back to bed. Attended PM groups. Went to cafeteria with peers for lunch, returned early to ward with c/o nausea. Supplemented with Ensure. PRN Zofran 4mg  PO given at 1257, he reported relief when reassessed at 1355. Quiet / reserved but pleasant when approached. Slight tremors note in bilateral hands. Pt. rated his depression 5/10 and hopelessness 2/10 with good concentration level on inventory sheet. Pt's goal for today was "Not to think about ETOH and Cigarettes".  He also verbalized that he needs to eat and sleep to feel better. A) All meds given as per order. Assessments completed. Support and availability offered. R) Pt. able to verbalized his needs safely. Q 15 minutes checks continues without behavioral outburst to note at this time.

## 2014-03-18 NOTE — Progress Notes (Signed)
D. Pt was up and visible in milieu this evening, pt did attend and participate in evening group activity. Pt did speak about feeling better but still complains of withdrawal and is still tremulous, anxious and sweaty, however pt does report improvement since when he came in. Pt did receive medications without incident. A. Support and encouragement provided. R. Safety maintained, will continue to monitor.

## 2014-03-18 NOTE — Plan of Care (Signed)
Problem: Ineffective individual coping Goal: STG: Patient will remain free from self harm Outcome: Progressing Pt. remains free from self injury or falls. Q15 minutes checks maintained.

## 2014-03-18 NOTE — Progress Notes (Signed)
Patient did attend the evening speaker AA meeting.  

## 2014-03-18 NOTE — Progress Notes (Signed)
[facility]  03/18/2014 12:55 PM Arthur [name]  MRN:  [id number] Subjective:    Patient is calm and cooperative and tells me he came in this time to seek treatment for his mental illness and alcohol addiction, "I didn't want to detox in the woods". .  He admitted to using alcohol to treat his mental illness, admits to being off his MH medications for up to 5 years.   Patient reports several detox treatment but ends up relapsing. Longest time sober was three months, a long time ago.  Patient states he is homeless and have been living in the woods for years.    He denies SI/HI but reported auditory hallucinations.  He admits to hearing voices, "it is really music, sometimes I can actually hear the lyrics".   His Church "[facility] [name]"  on [address]   And playing the guitar are his main support and coping mechanisms. Depression   5/10  Anxiety  10/10  [facility]   Plan for discharge is "back to the camp"  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, with panic attacks, R/O PTSD, R/O Panic Disorder  303.90 Alcohol Use Disorder, Severe  296.23 Major Depressive Disorder, Severe with psychotic features R/O Substance induced Depressive Disorder, R/O Psychotic Disorder  DSM5: Schizophrenia Disorders:   Obsessive-Compulsive Disorders:   Trauma-Stressor Disorders:  Posttraumatic Stress Disorder (309.81) Substance/Addictive Disorders:  Alcohol Related Disorder - Severe (303.90), Alcohol use disorder, severe Depressive Disorders:  Major Depressive Disorder - Severe (296.23) with Psychotic features Total Time spent with patient: 30 minutes   ADL's:  Intact  Sleep: Good  Appetite:  Poor  Suicidal Ideation:  Denies Homicidal Ideation:  Denies  Psychiatric Specialty Exam: Physical Exam  Constitutional: He is oriented to person, place, and time. He appears well-developed.  HENT:  Head: Normocephalic.  Neck: Normal range of motion. Neck supple.  Musculoskeletal: Normal range of  motion.  Neurological: He is alert and oriented to person, place, and time.  Skin: Skin is warm and dry.    ROS  Blood pressure 118/60, pulse 70, temperature 98.3 F (36.8 C), temperature source Oral, resp. rate 18, height 5' 8.9" (1.75 m), weight 63.957 kg (141 lb).Body mass index is 20.88 kg/(m^2).  General Appearance: Casual  Eye Contact::  Poor  Speech:  Clear and Coherent and Normal Rate  Volume:  Normal  Mood:  Depressed, Hopeless, Worthless and Helpless  Affect:  Congruent, Depressed and Flat  Thought Process:  Coherent  Orientation:  Full (Time, Place, and Person)  Thought Content:  WDL  Suicidal Thoughts:  No  Homicidal Thoughts:  No  Memory:  Immediate;   Good Recent;   Good Remote;   Fair  Judgement:  Impaired  Insight:  Fair  Psychomotor Activity:  Tremor  Concentration:  Fair  Recall:  NA  Fund of Knowledge:Good  Language: Good  Akathisia:  NA  Handed:  Right  AIMS (if indicated):     Assets:  Desire for Improvement Housing  Sleep:  Number of Hours: 3.5   Musculoskeletal: Strength & Muscle Tone: within normal limits Gait & Station: normal Patient leans: N/A  Current Medications: Current Facility-Administered Medications  Medication Dose Route Frequency Provider Last Rate Last Dose  . acetaminophen (TYLENOL) tablet 650 mg  650 mg Oral Q6H PRN Jomarie LongsSaramma Eappen, MD      . diclofenac sodium (VOLTAREN) 1 % transdermal gel 4 g  4 g Topical QID Jomarie LongsSaramma Eappen, MD   4 g at 03/18/14 0800  .  escitalopram (LEXAPRO) tablet 10 mg  10 mg Oral Daily Nehemiah MassedFernando Cobos, MD   10 mg at 03/18/14 [phone]  . feeding supplement (ENSURE COMPLETE) (ENSURE COMPLETE) liquid 237 mL  237 mL Oral BID BM Saramma Eappen, MD   237 mL at 03/17/14 1812  . folic acid (FOLVITE) tablet 1 mg  1 mg Oral Daily Saramma Eappen, MD   1 mg at 03/18/14 0842  . hydrOXYzine (ATARAX/VISTARIL) tablet 25 mg  25 mg Oral Q6H PRN Jomarie LongsSaramma Eappen, MD   25 mg at 03/15/14 1525  . loperamide (IMODIUM) capsule 2-4 mg  2-4  mg Oral PRN Jomarie LongsSaramma Eappen, MD      . LORazepam (ATIVAN) tablet 1 mg  1 mg Oral Q6H PRN Jomarie LongsSaramma Eappen, MD   1 mg at 03/17/14 2238  . [START ON 03/19/2014] LORazepam (ATIVAN) tablet 1 mg  1 mg Oral Daily Saramma Eappen, MD      . magnesium hydroxide (MILK OF MAGNESIA) suspension 30 mL  30 mL Oral Daily PRN Jomarie LongsSaramma Eappen, MD      . multivitamin with minerals tablet 1 tablet  1 tablet Oral Daily Jomarie LongsSaramma Eappen, MD   1 tablet at 03/18/14 0842  . nicotine (NICODERM CQ - dosed in mg/24 hours) patch 21 mg  21 mg Transdermal Q0600 Jomarie LongsSaramma Eappen, MD   21 mg at 03/18/14 0636  . ondansetron (ZOFRAN) tablet 4 mg  4 mg Oral Q8H PRN Lindwood QuaSheila May Agustin, NP      . thiamine (VITAMIN B-1) tablet 100 mg  100 mg Oral Daily Jomarie LongsSaramma Eappen, MD   100 mg at 03/18/14 0846  . traZODone (DESYREL) tablet 50 mg  50 mg Oral QHS PRN Jomarie LongsSaramma Eappen, MD   50 mg at 03/17/14 2237    Lab Results:  No results found for this or any previous visit (from the past 48 hour(s)).  Physical Findings: AIMS: Facial and Oral Movements Muscles of Facial Expression: None, normal Lips and Perioral Area: None, normal Jaw: None, normal Tongue: None, normal,Extremity Movements Upper (arms, wrists, hands, fingers): None, normal Lower (legs, knees, ankles, toes): None, normal, Trunk Movements Neck, shoulders, hips: None, normal, Overall Severity Severity of abnormal movements (highest score from questions above): None, normal Incapacitation due to abnormal movements: None, normal Patient's awareness of abnormal movements (rate only patient's report): No Awareness, Dental Status Current problems with teeth and/or dentures?: No Does patient usually wear dentures?: No  CIWA:  CIWA-Ar Total: 6 COWS:     Treatment Plan Summary: Daily contact with patient to assess and evaluate symptoms and progress in treatment Medication management  Plan:  Continue with plan of care Continue crisis management Encourage to participate in group and  individual sessions Continue medication management/ and review as needed Continue alcohol detox with our Ativan protocol -Escitalopram 10 mg by mouth daily for depression/anxiety -Trazodone 50 mg by mouth at bed time for insomnia, may repeat x1  If needed Offer ensure supplements as ordered. Discharge  Plan in progress Address health issues  as needed    Medical Decision Making Problem Points:  Established problem, stable/improving (1) Data Points:  Review and summation of old records (2) Review of medication regiment & side effects (2)  -no side effects reported  I certify that inpatient services furnished can reasonably be expected to improve the patient's condition.   Lorinda CreedLARACH, MARY   PMHNP-BC 03/18/2014, 12:55 PM I agreed with findings and treatment plan of this patient

## 2014-03-19 DIAGNOSIS — F1010 Alcohol abuse, uncomplicated: Secondary | ICD-10-CM

## 2014-03-19 MED ORDER — DICLOFENAC SODIUM 1 % TD GEL: 4.0000 g | TRANSDERMAL | Status: DC | PRN

## 2014-03-19 MED ORDER — ESCITALOPRAM OXALATE 10 MG PO TABS: 10.0000 mg | ORAL_TABLET | Freq: Every day | ORAL | Status: DC

## 2014-03-19 MED ORDER — QUETIAPINE FUMARATE 25 MG PO TABS: 12.5000 mg | ORAL_TABLET | Freq: Three times a day (TID) | ORAL | Status: DC | PRN

## 2014-03-19 MED ORDER — TRAZODONE HCL 50 MG PO TABS: 50.0000 mg | ORAL_TABLET | Freq: Every evening | ORAL | Status: DC | PRN

## 2014-03-19 MED ORDER — HYDROXYZINE HCL 25 MG PO TABS: 25.0000 mg | ORAL_TABLET | Freq: Every evening | ORAL | Status: DC | PRN

## 2014-03-19 NOTE — Progress Notes (Signed)
[facility] Adult Case Management Discharge Plan :  Will you be returning to the same living situation after discharge: Yes, [name] Patient is returning to his previous living environment. At discharge, do you have transportation home?:Yes,  Patient to be assessed with a city bus ticket. Do you have the ability to pay for your medications:No.  Patient needs assistance with indigent medications   Release of information consent forms completed and in the chart;  Patient's signature needed at discharge.  Patient to Follow up at: Follow-up Information   [facility] ([facility] Tuesday, March [facility], 2014 or any weekday between 8AM -12Noon or 1PM--3PM for medicationb management/counseling)    Contact information:   [address] [place], [place]   [id number]  [phone]      Patient denies SI/HI:   Patient no longer endorsing SI/HI or other thoughts of self harm.  Safety Planning and Suicide Prevention discussed: .Reviewed with all patients during discharge planning group   [name], Joesph JulyQuylle Hairston 03/19/2014, 12:48 PM

## 2014-03-19 NOTE — [facility] Suicide Risk Assessment (Signed)
[facility] INPATIENT:  Family/Significant Other Suicide Prevention Education  Suicide Prevention Education:  Patient Refusal for Family/Significant Other Suicide Prevention Education: The patient Arthur [name] has refused to provide written consent for family/significant other to be provided Family/Significant Other Suicide Prevention Education during admission and/or prior to discharge.  Physician notified.    Wynn BankerHodnett, Huel Centola Hairston 03/19/2014, 12:52 PM

## 2014-03-19 NOTE — [facility] Suicide Risk Assessment (Signed)
Demographic Factors:  43 year old single male, no children, homeless   Total Time spent with patient: 30 minutes  Psychiatric Specialty Exam: Physical Exam  ROS  Blood pressure 112/64, pulse 85, temperature 97.2 F (36.2 C), temperature source Oral, resp. rate 16, height 5' 8.9" (1.75 m), weight 63.957 kg (141 lb).Body mass index is 20.88 kg/(m^2).  General Appearance: Well Groomed  Patent attorneyye Contact::  Good  Speech:  Normal Rate  Volume:  Normal  Mood:  Euthymic  Affect:  Appropriate and Full Range  Thought Process:  Goal Directed and Linear  Orientation:  Full (Time, Place, and Person)  Thought Content:  no hallucinations, no delusions,   Suicidal Thoughts:  No- denies any suicidal or homicidal ideations  Homicidal Thoughts:  No  Memory:  recent and remote grossly intact  Judgement:  Fair  Insight:  Fair  Psychomotor Activity:  Normal  Concentration:  Good  Recall:  Good  Fund of Knowledge:Good  Language: Good  Akathisia:  No  Handed:  Right  AIMS (if indicated):     Assets:  Desire for Improvement Resilience Talents/Skills  Sleep:  Number of Hours: 3.75    Musculoskeletal: Strength & Muscle Tone: within normal limits Gait & Station: normal Patient leans: N/A   Mental Status Per Nursing Assessment::   On Admission:     Current Mental Status by Physician: At this time patient much improved- no ongoing withdrawal symptoms, calm, euthymic, with a full range of affect, no thought disorder, no suicidal or homicidal ideations, no psychotic symptoms.  Loss Factors: Homelessness, chronic medical illnesses ( thalassemia)   Historical Factors: Long history of alcohol dependence, history of panic disorder. No history of suicide attempts or self injurious behaviors   Risk Reduction Factors:   Positive coping skills or problem solving skills and resilience  Continued Clinical Symptoms:  At this time patient is euthymic, with a full range of affect, no thought disorder,  no SI or HI, no psychotic symptoms, future oriented - no withdrawal symptoms.   Cognitive Features That Contribute To Risk:  No gross cognitive deficits noted upon discharge- patient is alert , attentive, oriented x 3  Suicide Risk:  Minimal: No identifiable suicidal ideation.  Patients presenting with no risk factors but with morbid ruminations; may be classified as minimal risk based on the severity of the depressive symptoms  Discharge Diagnoses:   AXIS I:  Alcohol Dependence, Alcohol Withdrawal, Panic Disorder with Agoraphobia versus Alcohol induced Anxiety Disorder  AXIS II:  Deferred AXIS III:   Past Medical History  Diagnosis Date  . ETOH abuse   . Panic attack   . Hypertension   . Heart murmur   . Thalassemia    AXIS IV:  homelessness, limited sober support network AXIS V:  61-70 mild symptoms  Plan Of Care/Follow-up recommendations:  Activity:  As tolerated Diet:  low sodium , heart  healthy Tests:  NA Other:  See below  Is patient on multiple antipsychotic therapies at discharge:  No   Has Patient had three or more failed trials of antipsychotic monotherapy by history:  No  Recommended Plan for Multiple Antipsychotic Therapies: NA  Patient is leaving unit in good spirits. Patient is homeless and was offered referral to shelter by SW/staff, but stated prefers to return to live in his tent, where he is comfortable and has  Been living for years. He does state that when days /nights get colder he has friends he stays with.  Follow up with [facility] On 03/20/2014. ([facility] on Tuesday, March 20, 2014 or any weekday between 8AM -12Noon or 1PM--3PM for medicationb management/counseling)  Patient encouraged to go to AA on a regular basis  [facility] for ongoing outpatient medical treatment as needed.   Eulamae Greenstein 03/19/2014, 1:25 PM

## 2014-03-19 NOTE — Progress Notes (Signed)
D. Pt has been up and visible in milieu this evening, has been able to attend and participate in evening group. Pt reports feeling better. Pt does look better, however is still tremulous and appears to still be going through detox. Pt has received medications without incident this evening. A. Support and encouragement provided. R. Safety maintained, will continue to monitor.

## 2014-03-19 NOTE — Progress Notes (Signed)
Adult Psychoeducational Group Note  Date:  03/19/2014 Time:  10:00am Group Topic/Focus:  Wellness Toolbox:   The focus of this group is to discuss various aspects of wellness, balancing those aspects and exploring ways to increase the ability to experience wellness.  Patients will create a wellness toolbox for use upon discharge.  Participation Level:  Active  Participation Quality:  Appropriate and Attentive  Affect:  Appropriate  Cognitive:  Alert and Appropriate  Insight: Appropriate  Engagement in Group:  Engaged  Modes of Intervention:  Discussion and Education  Additional Comments:  Pt attended and participated in group. Question was asked What is you good and bad quality? 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  Shelly BombardGarner, Madelyne Millikan D 03/19/2014, 10:32 AM

## 2014-03-19 NOTE — [facility] Group Notes (Signed)
[facility]   03/19/2014 11:45 AM    Participation Quality:  Appropraite  Mood/Affect:  Appropriate  Depression Rating:  2  Anxiety Rating:  8  Thoughts of Suicide:  No  Will you contract for safety?   NA  Current AVH:  No  Plan for Discharge/Comments:  Patient attended discharge planning group and actively participated in group. He reports doing well and hopes to discharge soon.  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  [facility]. Suicide prevention education reviewed and SPE document provided.   Transportation Means: Patient uses public transportation.   Supports:  Patient has a limited support system.   Ronell Boldin, Joesph JulyQuylle Hairston

## 2014-03-19 NOTE — Progress Notes (Signed)
Pt was discharged home today.  He denied any S/I H/I or A/V hallucinations.  He was given f/u appointment, rx, sample medications, hotline info booklet, and bus pass.  He voiced understanding to all instructions provided.  He declined the need for smoking cessation materials.  He removed his nicotine patch before he left.

## 2014-03-22 NOTE — Progress Notes (Signed)
Patient Discharge Instructions:  After Visit Summary (AVS):   Faxed to:  03/22/14 Psychiatric Admission Assessment Note:   Faxed to:  03/22/14 Suicide Risk Assessment - Discharge Assessment:   Faxed to:  03/22/14 Faxed/Sent to the Next Level Care provider:  03/22/14 [facility] @ [phone]  Jerelene ReddenSheena E Lagrange, 03/22/2014, 4:02 PM
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
## 2014-08-08 ENCOUNTER — Encounter (HOSPITAL_COMMUNITY): Payer: Self-pay | Admitting: Emergency Medicine

## 2014-08-08 ENCOUNTER — Emergency Department (HOSPITAL_COMMUNITY)
Admission: EM | Admit: 2014-08-08 | Discharge: 2014-08-09 | Disposition: A | Discharge status: Home / Self Care | Payer: Federal, State, Local not specified - Other | Attending: Emergency Medicine | Admitting: Emergency Medicine

## 2014-08-08 DIAGNOSIS — Z79899 Other long term (current) drug therapy: Secondary | ICD-10-CM | POA: Insufficient documentation

## 2014-08-08 DIAGNOSIS — F329 Major depressive disorder, single episode, unspecified: Secondary | ICD-10-CM | POA: Insufficient documentation

## 2014-08-08 DIAGNOSIS — F1410 Cocaine abuse, uncomplicated: Secondary | ICD-10-CM | POA: Insufficient documentation

## 2014-08-08 DIAGNOSIS — R011 Cardiac murmur, unspecified: Secondary | ICD-10-CM | POA: Insufficient documentation

## 2014-08-08 DIAGNOSIS — Z720 Tobacco use: Secondary | ICD-10-CM | POA: Insufficient documentation

## 2014-08-08 DIAGNOSIS — F1010 Alcohol abuse, uncomplicated: Secondary | ICD-10-CM | POA: Insufficient documentation

## 2014-08-08 DIAGNOSIS — Z7982 Long term (current) use of aspirin: Secondary | ICD-10-CM | POA: Insufficient documentation

## 2014-08-08 DIAGNOSIS — F209 Schizophrenia, unspecified: Secondary | ICD-10-CM | POA: Insufficient documentation

## 2014-08-08 DIAGNOSIS — R000 Tachycardia, unspecified: Secondary | ICD-10-CM | POA: Insufficient documentation

## 2014-08-08 DIAGNOSIS — F1020 Alcohol dependence, uncomplicated: Secondary | ICD-10-CM | POA: Diagnosis present

## 2014-08-08 DIAGNOSIS — Z862 Personal history of diseases of the blood and blood-forming organs and certain disorders involving the immune mechanism: Secondary | ICD-10-CM | POA: Insufficient documentation

## 2014-08-08 DIAGNOSIS — I10 Essential (primary) hypertension: Secondary | ICD-10-CM | POA: Insufficient documentation

## 2014-08-08 LAB — COMPREHENSIVE METABOLIC PANEL
ALK PHOS: 105 U/L (ref 39–117)
ALT: 23 U/L (ref 0–53)
AST: 38 U/L — ABNORMAL HIGH (ref 0–37)
Albumin: 4.3 g/dL (ref 3.5–5.2)
Anion gap: 12 (ref 5–15)
BILIRUBIN TOTAL: 1.3 mg/dL — AB (ref 0.3–1.2)
BUN: 10 mg/dL (ref 6–23)
CO2: 24 mmol/L (ref 19–32)
Calcium: 8.8 mg/dL (ref 8.4–10.5)
Chloride: 106 mmol/L (ref 96–112)
Creatinine, Ser: 0.85 mg/dL (ref 0.50–1.35)
GLUCOSE: 100 mg/dL — AB (ref 70–99)
Potassium: 4.1 mmol/L (ref 3.5–5.1)
Sodium: 142 mmol/L (ref 135–145)
TOTAL PROTEIN: 7.7 g/dL (ref 6.0–8.3)

## 2014-08-08 LAB — CBC
HCT: 32.9 % — ABNORMAL LOW (ref 39.0–52.0)
Hemoglobin: 10.3 g/dL — ABNORMAL LOW (ref 13.0–17.0)
MCH: 22.2 pg — ABNORMAL LOW (ref 26.0–34.0)
MCHC: 31.3 g/dL (ref 30.0–36.0)
MCV: 70.9 fL — ABNORMAL LOW (ref 78.0–100.0)
Platelets: 237 10*3/uL (ref 150–400)
RBC: 4.64 MIL/uL (ref 4.22–5.81)
RDW: 20.7 % — AB (ref 11.5–15.5)
WBC: 14.5 10*3/uL — AB (ref 4.0–10.5)

## 2014-08-08 LAB — ACETAMINOPHEN LEVEL: Acetaminophen (Tylenol), Serum: <10 ug/mL — ABNORMAL LOW (ref 10–30)

## 2014-08-08 LAB — RAPID URINE DRUG SCREEN, HOSP PERFORMED
AMPHETAMINES: NOT DETECTED
Barbiturates: NOT DETECTED
Benzodiazepines: NOT DETECTED
Cocaine: POSITIVE — AB
Opiates: NOT DETECTED
TETRAHYDROCANNABINOL: NOT DETECTED

## 2014-08-08 LAB — SALICYLATE LEVEL

## 2014-08-08 LAB — ETHANOL: ALCOHOL ETHYL (B): 238 mg/dL — AB (ref 0–9)

## 2014-08-08 MED ORDER — VITAMIN B-1 100 MG PO TABS
100.0000 mg | ORAL_TABLET | Freq: Every day | ORAL | Status: DC
  Administered 2014-08-08 – 2014-08-09 (×2): 100 mg via ORAL
  Filled 2014-08-08 (×2): qty 1

## 2014-08-08 MED ORDER — SODIUM CHLORIDE 0.9 % IV BOLUS (SEPSIS)
1000.0000 mL | Freq: Once | INTRAVENOUS | Status: AC
  Administered 2014-08-08: 1000 mL via INTRAVENOUS

## 2014-08-08 MED ORDER — LORAZEPAM 1 MG PO TABS
0.0000 mg | ORAL_TABLET | Freq: Four times a day (QID) | ORAL | Status: DC
  Administered 2014-08-08 (×2): 1 mg via ORAL
  Filled 2014-08-08 (×2): qty 1

## 2014-08-08 MED ORDER — ESCITALOPRAM OXALATE 10 MG PO TABS
10.0000 mg | ORAL_TABLET | Freq: Every day | ORAL | Status: DC
  Administered 2014-08-08 – 2014-08-09 (×2): 10 mg via ORAL
  Filled 2014-08-08 (×2): qty 1

## 2014-08-08 MED ORDER — FOLIC ACID 1 MG PO TABS
1.0000 mg | ORAL_TABLET | Freq: Every day | ORAL | Status: DC
  Administered 2014-08-08 – 2014-08-09 (×2): 1 mg via ORAL
  Filled 2014-08-08 (×2): qty 1

## 2014-08-08 MED ORDER — THIAMINE HCL 100 MG/ML IJ SOLN
100.0000 mg | Freq: Every day | INTRAMUSCULAR | Status: DC
  Filled 2014-08-08: qty 2

## 2014-08-08 MED ORDER — NICOTINE 21 MG/24HR TD PT24
21.0000 mg | MEDICATED_PATCH | Freq: Every day | TRANSDERMAL | Status: DC
  Administered 2014-08-08 – 2014-08-09 (×2): 21 mg via TRANSDERMAL
  Filled 2014-08-08 (×2): qty 1

## 2014-08-08 MED ORDER — LORAZEPAM 1 MG PO TABS: 0.0000 mg | ORAL_TABLET | Freq: Two times a day (BID) | ORAL | Status: DC

## 2014-08-08 MED ORDER — HYDROXYZINE HCL 25 MG PO TABS: 25.0000 mg | ORAL_TABLET | Freq: Two times a day (BID) | ORAL | Status: DC | PRN

## 2014-08-08 MED ORDER — QUETIAPINE FUMARATE 50 MG PO TABS
150.0000 mg | ORAL_TABLET | Freq: Every day | ORAL | Status: DC
  Administered 2014-08-08: 150 mg via ORAL
  Filled 2014-08-08 (×2): qty 1
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
## 2014-08-08 NOTE — BH Assessment (Signed)
DNP, Catha NottinghamJamison will inform TTS Jessie Foot([place]) of the consult.

## 2014-08-08 NOTE — ED Provider Notes (Signed)
CSN: [id number]     Arrival date & time 08/08/14  1330 History   First MD Initiated Contact with Patient 08/08/14 1423     Chief Complaint  Patient presents with  . Anxiety  . Alcohol Problem     (Consider location/radiation/quality/duration/timing/severity/associated sxs/prior Treatment) HPI  44 year old male presents voluntarily for treatment of his alcoholism, schizophrenia, and depression. He states she's been having issues with alcoholism since he was 44 years old. Patient drinks about a half gallon of liquor per day. Last drink around 2 hours prior to arrival. Patient states that he wants help for getting clean off of alcohol. He also wants help because he has multiple personalities and hears voices. He is on Seroquel but his been off of his Vistaril and trazodone for the past one month. Last took Seroquel couple days ago. Patient states that he has thoughts of hurting people when he gets angry but has not done this yet and does not really think he would. No thoughts of hurting himself. Patient states he is currently feeling anxious and thinks that is why his heart rate is currently elevated. Otherwise has not been sick and no vomiting or diarrhea. No tremors.  Past Medical History  Diagnosis Date  . ETOH abuse   . Panic attack   . Hypertension   . Heart murmur   . Thalassemia    History reviewed. No pertinent past surgical history. Family History  Problem Relation Age of Onset  . Cancer Mother   . Mental illness Mother   . Mental illness Father    History  Substance Use Topics  . Smoking status: Current Every Day Smoker -- 1.00 packs/day  . Smokeless tobacco: Never Used  . Alcohol Use: 15.0 oz/week    5 Cans of beer, 20 Shots of liquor per week    Review of Systems  Constitutional: Negative for fever.  Gastrointestinal: Negative for vomiting, abdominal pain and diarrhea.  Neurological: Negative for tremors.  Psychiatric/Behavioral: Positive for dysphoric mood. Negative  for suicidal ideas and self-injury. The patient is nervous/anxious.   All other systems reviewed and are negative.     Allergies  Haldol and Risperidone and related  Home Medications   Prior to Admission medications   Medication Sig Start Date End Date Taking? Authorizing Provider  ASPIRIN PO Take 2 tablets by mouth daily.   Yes Historical Provider, MD  escitalopram (LEXAPRO) 10 MG tablet Take 1 tablet (10 mg total) by mouth daily. 03/19/14  Yes Adonis Brook, NP  folic acid (FOLVITE) 1 MG tablet Take 1 mg by mouth daily.   Yes Historical Provider, MD  hydrOXYzine (ATARAX/VISTARIL) 25 MG tablet Take 1 tablet (25 mg total) by mouth at bedtime as needed for anxiety. Patient taking differently: Take 25 mg by mouth 2 (two) times daily as needed for anxiety.  03/19/14  Yes Adonis Brook, NP  QUEtiapine (SEROQUEL) 100 MG tablet Take 150 mg by mouth at bedtime.   Yes Historical Provider, MD  traZODone (DESYREL) 50 MG tablet Take 1 tablet (50 mg total) by mouth at bedtime as needed for sleep. For insomnia Patient not taking: Reported on 08/08/2014 03/19/14   Adonis Brook, NP   BP 105/55 mmHg  Pulse 114  Temp(Src) 98.4 F (36.9 C) (Oral)  Resp 16  SpO2 93% Physical Exam  Constitutional: He is oriented to person, place, and time. He appears well-developed and well-nourished. No distress.  HENT:  Head: Normocephalic and atraumatic.  Right Ear: External ear normal.  Left Ear:  External ear normal.  Nose: Nose normal.  Eyes: Right eye exhibits no discharge. Left eye exhibits no discharge.  Neck: Neck supple.  Cardiovascular: Regular rhythm, normal heart sounds and intact distal pulses.  Tachycardia present.   Pulmonary/Chest: Effort normal and breath sounds normal.  Abdominal: Soft. He exhibits no distension. There is no tenderness.  Musculoskeletal: He exhibits no edema.  Neurological: He is alert and oriented to person, place, and time.  Skin: Skin is warm and dry. He is not  diaphoretic.  Nursing note and vitals reviewed.   ED Course  Procedures (including critical care time) Labs Review Labs Reviewed  ACETAMINOPHEN LEVEL - Abnormal; Notable for the following:    Acetaminophen (Tylenol), Serum <10.0 (*)    All other components within normal limits  CBC - Abnormal; Notable for the following:    WBC 14.5 (*)    Hemoglobin 10.3 (*)    HCT 32.9 (*)    MCV 70.9 (*)    MCH 22.2 (*)    RDW 20.7 (*)    All other components within normal limits  COMPREHENSIVE METABOLIC PANEL - Abnormal; Notable for the following:    Glucose, Bld 100 (*)    AST 38 (*)    Total Bilirubin 1.3 (*)    All other components within normal limits  ETHANOL - Abnormal; Notable for the following:    Alcohol, Ethyl (B) 238 (*)    All other components within normal limits  URINE RAPID DRUG SCREEN (HOSP PERFORMED) - Abnormal; Notable for the following:    Cocaine POSITIVE (*)    All other components within normal limits  SALICYLATE LEVEL    Imaging Review No results found.   EKG Interpretation None      MDM   Final diagnoses:  Alcohol abuse    Patient appears well except for tachycardia. Given IV fluids with good response. Not actively suicidal or hallucinating, stable for psych to see now that HR has improved. No signs of infection to account for WBC. HR likely from chronic drinking with associated dehydration. Does not appear in withdrawal at this time, will put on CIWA    Pricilla LovelessScott Jayni Prescher, MD 08/08/14 1801

## 2014-08-08 NOTE — ED Notes (Signed)
Per EMS, Pt homeless. Pt here for alcohol, cocaine, and marijuana detox. Last time of ingestion was yesterday. Pt sts he wants to get off of it so that he "can get married." A&Ox4. Ambulatory.

## 2014-08-08 NOTE — ED Notes (Addendum)
Pt c/o needing alcohol detox and having "panic attacks." Averages half a gallon of liquor per day. Says he is out of Lexapro, Vistaril, and Seroquel. Is being [address] Has a psychiatrist. Currently homeless. When asked about SI/HI ideations reports, "Well I don't want to be locked away naked in a cell but I ain't too happy and sometimes I think about hurting other people. Usually I would just whoop the hell out of them. I took martial arts when I was a kid. I can be very vicious (when asked if he had a plan. I need a program. I want to get help (last treatment completed at 44 years of age." Patient appears nervous and anxious. No other c/c.

## 2014-08-08 NOTE — ED Notes (Addendum)
Patient comes from home asking for assistance to detox from alcohol.  Patient states he drinks @ a fifth of liquor a day and 3-4 40 oz beers a day.  Patient denies use illicit drugs.  Patient denies SI/HI.  Patient states his last drink was around 1 hour PTA.

## 2014-08-08 NOTE — ED Notes (Signed)
Bed: [facility]PT4 Expected date:  Expected time:  Means of arrival:  Comments: Ems detox

## 2014-08-08 NOTE — Progress Notes (Signed)
  CARE MANAGEMENT ED NOTE 08/08/2014  Patient:  Arthur [name],Arthur [name]   Account Number:  [id number]  Date Initiated:  08/08/2014  Documentation initiated by:  Radford PaxFERRERO,Hal Norrington  Subjective/Objective Assessment:   Patient presents to Ed with requesting detox from alcohol     Subjective/Objective Assessment Detail:   Patient with pmhx of schizophrenia, and depression.     Action/Plan:   TTS consult   Action/Plan Detail:   Anticipated DC Date:       Status Recommendation to Physician:   Result of Recommendation:    Other ED Services  Consult Working Plan   In-house referral  Clinical Social Worker   DC Associate Professorlanning Services  Other  PCP issues    Choice offered to / List presented to:            Status of service:  Completed, signed off  ED Comments:   ED Comments Detail:  EDCM spoke to patient at bedside.  Patient confirms he is homeless.  "I've been living in the woods for 4 years and on the streets for ten.  That's a long time."  Patient [facility] Patient reports he sees a NP there and a therapist.  Patient cannot remember the name of the NP. Patient reports he saw his therapist last Friday.  Patient reports he missed his appointment with the NP but spoke to her on the phone.  Patient reports [facility] Seroquel, Lexapro and vistaril.  [facility] asked [facility] if he had any difficulty affording his medications?  Patient reports, "the meds are free."  Patient reports he was in prison, did not sepcify when.  Patient reports friends allow him to do his laudry and takes showers at their place.  [facility].  No further EDCM needs at this time.

## 2014-08-08 NOTE — BH Assessment (Signed)
Sent referrals for [facility] to [facility], and [facility].    Clista BernhardtNancy Litha Lamartina, [facility] Triage Specialist 08/08/2014 9:30 PM

## 2014-08-08 NOTE — Progress Notes (Signed)
CSW met with pt at bedside. There was no family present. Patient states that he is here requesting detox from alcohol. Pt stated " Im trying to stop drinking." Pt states that he has been drinking for 32 years. He states that his last drink was this morning. Patient informed CSW that he consumed a 5th of liquor and had a couple of beers.  Patient informed CSW that he is currently homeless. He states that he lives in the [place], in the woods inside of a tent. Patient states that he has been living in the woods for 4 years. Patient states that his tent is off of [address].  Patient states that he is unemployed. 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 Patient says that he makes some money from playing the guitar which allows him to buy meals sometimes. Patient also stated that he gets food from the dumpster. Also, patient stated that he does not have a good support system. He informed CSW that he has family in [place]. However, they do not talk to him.  During the interview pt stated that his chest was hurting. Pt informed CSW that he often has panic attacks. Patient also informed CSW that he can complete his ADL's independently.   CSW gave pt resource information regarding food and shelter. Patient states that he does not have any questions.  Willette Brace [phone] ED CSW 08/08/2014 9:38 PM

## 2014-08-09 DIAGNOSIS — F1020 Alcohol dependence, uncomplicated: Secondary | ICD-10-CM

## 2014-08-09 NOTE — BH Assessment (Signed)
BHH Assessment Progress Note  Per Thedore MinsMojeed Akintayo, MD this pt does not require psychiatric hospitalization at this time.  He [facility] to whom he has signed Consent to Release Information.  Pt would also benefit from outpatient substance abuse treatment.  [facility], and it has been found that he has an appointment scheduled tomorrow, 08/10/2014 at 12:00 noon.  This has been included in pt's discharge instructions.  They also advise pt to ask them about enrolling in their substance abuse treatment program, or to contact [facility] to see about enrolling in CD-IOP.  Pt's nurse has been notified.  Doylene Canninghomas Tonjua Rossetti, MA Triage Specialist 08/09/2014 @ 11:50

## 2014-08-09 NOTE — ED Notes (Addendum)
TOM ACT at bedside. DISCUSSED DISCHARGE OUT PT SERVICES/ FAMILY SERVICES. PT ALREADY RECEIVES OUT PT SERVICES. D/C PENDING

## 2014-08-09 NOTE — BHH Suicide Risk Assessment (Cosign Needed)
Suicide Risk Assessment  Discharge Assessment   [facility]   Demographic Factors:  Male, Caucasian, Low socioeconomic status and Unemployed  Total Time spent with patient: 20 minutes  Musculoskeletal: Strength & Muscle Tone: within normal limits Gait & Station: normal Patient leans: N/A  Psychiatric Specialty Exam:     Blood pressure 130/69, pulse 85, temperature 98.5 F (36.9 C), temperature source Oral, resp. rate 18, SpO2 97 %.There is no weight on file to calculate BMI.  General Appearance: Casual and Disheveled  Eye Contact::  Good  Speech:  Clear and Coherent and Normal Rate409  Volume:  Normal  Mood:  Anxious and Depressed  Affect:  Congruent  Thought Process:  Coherent, Goal Directed and Intact  Orientation:  Full (Time, Place, and Person)  Thought Content:  WDL  Suicidal Thoughts:  No  Homicidal Thoughts:  No  Memory:  Immediate;   Good Recent;   Good Remote;   Good  Judgement:  Good  Insight:  Good  Psychomotor Activity:  Normal and Tremor  Concentration:  Good  Recall:  NA  Fund of Knowledge:Good  Language: Good  Akathisia:  NA  Handed:  Right  AIMS (if indicated):     Assets:  Desire for Improvement Financial Resources/Insurance Housing  Sleep:     Cognition: WNL  ADL's:  Impaired      Has this patient used any form of tobacco in the last 30 days? (Cigarettes, Smokeless Tobacco, Cigars, and/or Pipes) N/A  Mental Status Per Nursing Assessment::   On Admission:     Current Mental Status by Physician: NA  Loss Factors: NA  Historical Factors: NA  Risk Reduction Factors:   Positive social support and Positive coping skills or problem solving skills  Continued Clinical Symptoms:  Depression:   Insomnia Alcohol/Substance Abuse/Dependencies  Cognitive Features That Contribute To Risk:  Polarized thinking    Suicide Risk:  Minimal: No identifiable suicidal ideation.  Patients presenting with no risk factors  but with morbid ruminations; may be classified as minimal risk based on the severity of the depressive symptoms  Principal Problem: Alcohol use disorder, severe, dependence Discharge Diagnoses:  Patient Active Problem List   Diagnosis Date Noted  . Alcohol use disorder, severe, dependence [F10.20] 08/09/2014    Priority: High  . Alcohol abuse [F10.10] 03/15/2014  . Thalassemia [D56.9]       Plan Of Care/Follow-up recommendations:  Activity:  AS TOLERATED Diet:  REGULAR  Is patient on multiple antipsychotic therapies at discharge:  No   Has Patient had three or more failed trials of antipsychotic monotherapy by history:  No  Recommended Plan for Multiple Antipsychotic Therapies: NA    Demarlo Riojas C    PMHNP-BC 08/09/2014, 12:21 PM

## 2014-08-09 NOTE — Consult Note (Signed)
[facility] Face-to-Face Psychiatry Consult   Reason for Consult: Alcohol use disorder, Severe Referring Physician:  EDP Patient Identification: Arthur [name] MRN:  [id number] Principal Diagnosis: Alcohol use disorder, severe, dependence Diagnosis:   Patient Active Problem List   Diagnosis Date Noted  . Alcohol use disorder, severe, dependence [F10.20] 08/09/2014    Priority: High  . Alcohol abuse [F10.10] 03/15/2014  . Thalassemia [D56.9]     Total Time spent with patient: 1 hour  Subjective:   Arthur [name] is a 44 y.o. male patient admitted with Alcohol use disorder.  HPI: Caucasian male, 44 years old was assessed for Alcohol use disorder.  Patient reported that he has been drinking for 30 years and that he is ready to stop now.  He reports drinking daily  And that he drinks half a gallon of Liquor a day.   He reports detox treatment once a long time ago.  He is homeless and and receives treatment for depression and anxiety at [facility] services of which he has an appointment tomorrow.  Patient states that he is ready to live a better and cleaner life.  He has been homeless for 4 years and live in a tent.  He denies SI/HI/AVH and was calm and cooperative during the assessment.Marland Kitchen  He will be discharged now.   Information for outpatient rehabilitation is provided for patient.  HPI Elements:   Location:  Alcohol use disorder, severe. Quality:  severe use, have been drinking since 30 years now.. Severity:  severe. Timing:  Acute. Duration:  Chronic mental illness and Alcoholism. Context:  Seeking treatment for Alcoholism.  Past Medical History:  Past Medical History  Diagnosis Date  . ETOH abuse   . Panic attack   . Hypertension   . Heart murmur   . Thalassemia    History reviewed. No pertinent past surgical history. Family History:  Family History  Problem Relation Age of Onset  . Cancer Mother   . Mental illness Mother   . Mental illness Father    Social History:  History   Alcohol Use  . 15.0 oz/week  . 5 Cans of beer, 20 Shots of liquor per week     History  Drug Use  . Yes  . Special: Marijuana    History   Social History  . Marital Status: Single    Spouse Name: N/A  . Number of Children: N/A  . Years of Education: N/A   Social History Main Topics  . Smoking status: Current Every Day Smoker -- 1.00 packs/day  . Smokeless tobacco: Never Used  . Alcohol Use: 15.0 oz/week    5 Cans of beer, 20 Shots of liquor per week  . Drug Use: Yes    Special: Marijuana  . Sexual Activity: Not on file   Other Topics Concern  . None   Social History Narrative   Additional Social History:    Pain Medications: SEE MAR Prescriptions: SEE MAR Over the Counter: SEE MAR History of alcohol / drug use?: Yes Negative Consequences of Use: Personal relationships Withdrawal Symptoms: Sweats, Diarrhea, Agitation, Tingling, Irritability, Weakness, Nausea / Vomiting, Seizures Onset of Seizures: 44 yrs old  Date of most recent seizure: "last month" Name of Substance 1: Alcohol 1 - Age of First Use: 44 yrs old 1 - Amount (size/oz): 1/2 gallone of vodka  1 - Frequency: daily since 44 yrs old  1 - Duration: on-going  1 - Last Use / Amount: 5 hrs ago prior to arrival ; "today"  Name of Substance 2: THC 2 - Age of First Use: 44 yrs old  2 - Amount (size/oz): 1 joint  2 - Frequency: 1x per year 2 - Duration: on-going  2 - Last Use / Amount: "few days ago"                 Allergies:   Allergies  Allergen Reactions  . Haldol [Haloperidol Lactate] Other (See Comments)    LOCK JAW  . Risperidone And Related Other (See Comments)    Prefers not to take -- "makes you grow titties"    Vitals: Blood pressure 130/69, pulse 85, temperature 98.5 F (36.9 C), temperature source Oral, resp. rate 18, SpO2 97 %.  Risk to Self: Suicidal Ideation: No Suicidal Intent: No Is patient at risk for suicide?: No Suicidal Plan?: No Access to Means: No What has been  your use of drugs/alcohol within the last 12 months?:  (daily alcohol use; occasional THC use) How many times?:  (0) Other Self Harm Risks:  (n/a) Triggers for Past Attempts: Other (Comment) (pt denies previous attempts/gestures ) Intentional Self Injurious Behavior: None Risk to Others: Homicidal Ideation: No-Not Currently/Within Last 6 Months (I will hurt someone if they try to hurt me) Thoughts of Harm to Others: No-Not Currently Present/Within Last 6 Months (I had thoughts to hurt those that harm children and woment.) Comment - Thoughts of Harm to Others:  (no currently thoughts; past thoughts ) Current Homicidal Intent: No Current Homicidal Plan: No Access to Homicidal Means: No Identified Victim:  (anyone that disrespects women and children) History of harm to others?: Yes (hx of getting into fights) Assessment of Violence: None Noted (currently calm and cooperative ) Violent Behavior Description:  (patient calm and cooperative ) Does patient have access to weapons?: No Criminal Charges Pending?: No Does patient have a court date: No Prior Inpatient Therapy: Prior Inpatient Therapy: Yes Prior Therapy Dates:  (many facilities over the course of 20 plus yrs; dates unk) Prior Therapy Facilty/Provider(s):  (Pt sts, "[place], [place]") Reason for Treatment: substance abuse Prior Outpatient Therapy: Prior Outpatient Therapy: Yes Prior Therapy Dates:  (current) Prior Therapy Facilty/Provider(s[facility]  [facility] Reason for Treatment:  (depression and substance abuse)  Current Facility-Administered Medications  Medication Dose Route Frequency Provider Last Rate Last Dose  . escitalopram (LEXAPRO) tablet 10 mg  10 mg Oral Daily Pricilla LovelessScott Goldston, MD   10 mg at 08/09/14 1012  . folic acid (FOLVITE) tablet 1 mg  1 mg Oral Daily Pricilla LovelessScott Goldston, MD   1 mg at 08/09/14 1012  . hydrOXYzine (ATARAX/VISTARIL) tablet 25 mg  25 mg Oral BID PRN Pricilla LovelessScott  Goldston, MD      . LORazepam (ATIVAN) tablet 0-4 mg  0-4 mg Oral 4 times per day Pricilla LovelessScott Goldston, MD   1 mg at 08/08/14 2351   Followed by  . [START ON 08/10/2014] LORazepam (ATIVAN) tablet 0-4 mg  0-4 mg Oral Q12H Pricilla LovelessScott Goldston, MD      . nicotine (NICODERM CQ - dosed in mg/24 hours) patch 21 mg  21 mg Transdermal Daily Pricilla LovelessScott Goldston, MD   21 mg at 08/09/14 1015  . QUEtiapine (SEROQUEL) tablet 150 mg  150 mg Oral QHS Pricilla LovelessScott Goldston, MD   150 mg at 08/08/14 2142  . thiamine (VITAMIN B-1) tablet 100 mg  100 mg Oral Daily Pricilla LovelessScott Goldston, MD   100 mg at 08/09/14 1012   Or  . thiamine (B-1) injection 100  mg  100 mg Intravenous Daily Pricilla Loveless, MD       Current Outpatient Prescriptions  Medication Sig Dispense Refill  . ASPIRIN PO Take 2 tablets by mouth daily.    Marland Kitchen escitalopram (LEXAPRO) 10 MG tablet Take 1 tablet (10 mg total) by mouth daily. 30 tablet 0  . folic acid (FOLVITE) 1 MG tablet Take 1 mg by mouth daily.    . hydrOXYzine (ATARAX/VISTARIL) 25 MG tablet Take 1 tablet (25 mg total) by mouth at bedtime as needed for anxiety. (Patient taking differently: Take 25 mg by mouth 2 (two) times daily as needed for anxiety. ) 30 tablet 0  . QUEtiapine (SEROQUEL) 100 MG tablet Take 150 mg by mouth at bedtime.    . traZODone (DESYREL) 50 MG tablet Take 1 tablet (50 mg total) by mouth at bedtime as needed for sleep. For insomnia (Patient not taking: Reported on 08/08/2014) 30 tablet 0    Musculoskeletal: Strength & Muscle Tone: within normal limits Gait & Station: normal Patient leans: N/A  Psychiatric Specialty Exam:     Blood pressure 130/69, pulse 85, temperature 98.5 F (36.9 C), temperature source Oral, resp. rate 18, SpO2 97 %.There is no weight on file to calculate BMI.  General Appearance: Casual and Disheveled  Eye Contact::  Good  Speech:  Clear and Coherent and Normal Rate  Volume:  Normal  Mood:  Depressed  Affect:  Congruent  Thought Process:  Coherent, Goal Directed and  Intact  Orientation:  Full (Time, Place, and Person)  Thought Content:  WDL  Suicidal Thoughts:  No  Homicidal Thoughts:  No  Memory:  Immediate;   Good Recent;   Good Remote;   Good  Judgement:  Good  Insight:  Good  Psychomotor Activity:  Tremor  Concentration:  Good  Recall:  NA  Fund of Knowledge:Good  Language: Good  Akathisia:  NA  Handed:  Right  AIMS (if indicated):     Assets:  Desire for Improvement Financial Resources/Insurance Housing  ADL's:  Impaired  Cognition: WNL  Sleep:      Medical Decision Making: Established Problem, Stable/Improving (1)  Treatment Plan Summary: Plan Discharge home, will see his MH provider in am.  Plan:  Discharge home Disposition: see above  Earney Navy   PMHNP-BC 08/09/2014 12:04 PM Patient seen face-to-face for psychiatric evaluation, chart reviewed and case discussed with the physician extender and developed treatment plan. Reviewed the information documented and agree with the treatment plan. Thedore Mins, MD

## 2014-08-09 NOTE — Discharge Instructions (Signed)
For your ongoing behavioral health needs you are advised to [facility] with [facility].  You have an appointment scheduled for tomorrow, 08/10/2014 at 12:00:       [facility]      [address]      [place], [place] [address]      [phone]  To help you maintain a sober lifestyle, a substance abuse treatment program may be beneficial to you.  Please note that [facility] also provides substance abuse treatment.  Ask them about enrolling in their program.  Another option to consider is the Chemical Dependency Intensive Outpatient Program (CD-IOP) at [facility].  Call them to see about enrolling in their program:       [facility] ([facility]      [address]      [place], [place] [address]      [phone]

## 2014-08-09 NOTE — ED Notes (Signed)
TTS PRESENT SPEAKING WITH THIS PT

## 2014-08-15 ENCOUNTER — Encounter (HOSPITAL_COMMUNITY): Payer: Self-pay

## 2014-08-15 ENCOUNTER — Emergency Department (HOSPITAL_COMMUNITY)
Admission: EM | Admit: 2014-08-15 | Discharge: 2014-08-15 | Disposition: A | Discharge status: Home / Self Care | Payer: Self-pay | Attending: Emergency Medicine | Admitting: Emergency Medicine

## 2014-08-15 DIAGNOSIS — Z862 Personal history of diseases of the blood and blood-forming organs and certain disorders involving the immune mechanism: Secondary | ICD-10-CM | POA: Insufficient documentation

## 2014-08-15 DIAGNOSIS — Z720 Tobacco use: Secondary | ICD-10-CM | POA: Insufficient documentation

## 2014-08-15 DIAGNOSIS — Z8659 Personal history of other mental and behavioral disorders: Secondary | ICD-10-CM | POA: Insufficient documentation

## 2014-08-15 DIAGNOSIS — I10 Essential (primary) hypertension: Secondary | ICD-10-CM | POA: Insufficient documentation

## 2014-08-15 DIAGNOSIS — R443 Hallucinations, unspecified: Secondary | ICD-10-CM | POA: Insufficient documentation

## 2014-08-15 DIAGNOSIS — R011 Cardiac murmur, unspecified: Secondary | ICD-10-CM | POA: Insufficient documentation

## 2014-08-15 DIAGNOSIS — Z79899 Other long term (current) drug therapy: Secondary | ICD-10-CM | POA: Insufficient documentation

## 2014-08-15 LAB — COMPREHENSIVE METABOLIC PANEL
ALT: 15 U/L (ref 0–53)
ANION GAP: 4 — AB (ref 5–15)
AST: 21 U/L (ref 0–37)
Albumin: 4 g/dL (ref 3.5–5.2)
Alkaline Phosphatase: 84 U/L (ref 39–117)
BILIRUBIN TOTAL: 1 mg/dL (ref 0.3–1.2)
BUN: 10 mg/dL (ref 6–23)
CALCIUM: 9.3 mg/dL (ref 8.4–10.5)
CHLORIDE: 103 mmol/L (ref 96–112)
CO2: 31 mmol/L (ref 19–32)
CREATININE: 0.68 mg/dL (ref 0.50–1.35)
GFR calc Af Amer: >90 mL/min (ref >90)
GFR calc non Af Amer: >90 mL/min (ref >90)
Glucose, Bld: 76 mg/dL (ref 70–99)
Potassium: 3.9 mmol/L (ref 3.5–5.1)
Sodium: 138 mmol/L (ref 135–145)
Total Protein: 7.2 g/dL (ref 6.0–8.3)

## 2014-08-15 LAB — ACETAMINOPHEN LEVEL

## 2014-08-15 LAB — RAPID URINE DRUG SCREEN, HOSP PERFORMED
Amphetamines: NOT DETECTED
Barbiturates: NOT DETECTED
Benzodiazepines: NOT DETECTED
Cocaine: NOT DETECTED
Opiates: NOT DETECTED
Tetrahydrocannabinol: NOT DETECTED

## 2014-08-15 LAB — CBC
HCT: 32.6 % — ABNORMAL LOW (ref 39.0–52.0)
Hemoglobin: 9.9 g/dL — ABNORMAL LOW (ref 13.0–17.0)
MCH: 22.2 pg — ABNORMAL LOW (ref 26.0–34.0)
MCHC: 30.4 g/dL (ref 30.0–36.0)
MCV: 73.3 fL — ABNORMAL LOW (ref 78.0–100.0)
Platelets: 287 10*3/uL (ref 150–400)
RBC: 4.45 MIL/uL (ref 4.22–5.81)
RDW: 22.1 % — AB (ref 11.5–15.5)
WBC: 11.1 10*3/uL — AB (ref 4.0–10.5)

## 2014-08-15 LAB — SALICYLATE LEVEL: Salicylate Lvl: <4 mg/dL (ref 2.8–20.0)

## 2014-08-15 LAB — ETHANOL: Alcohol, Ethyl (B): <5 mg/dL (ref 0–9)

## 2014-08-15 NOTE — ED Provider Notes (Signed)
CSN: [id number]     Arrival date & time 08/15/14  1620 History   First MD Initiated Contact with Patient 08/15/14 1642     Chief Complaint  Patient presents with  . Hallucinations     (Consider location/radiation/quality/duration/timing/severity/associated sxs/prior Treatment) HPI Arthur [name] is a 44 y.o. male with a history of alcohol abuse comes in for evaluation of hearing voices. Patient recently seen a day Loraine Leriche for alcohol detox, completed on Saturday. Was told he needed to come back to the ED for medical clearance in order to receive services for hallucinations as this was a new problem for them. Patient states he has been hearing voices in his head since he was 37. He reports he mostly hears them at night, 6-7 people whispering. He reports he feels like "two other people are living inside me, one of them named Raiford Noble who tells me to lie, cheat and steal, the other one Ron, who just likes to fight". He reports being on Seroquel which does not help with voices, but does help him sleep. Reports this is prescribed from [facility]. Denies any suicidal or homicidal ideations. Reports he has not had any alcohol since Saturday. No benzodiazepine use. Denies any other symptoms at this time.  Past Medical History  Diagnosis Date  . ETOH abuse   . Panic attack   . Hypertension   . Heart murmur   . Thalassemia    History reviewed. No pertinent past surgical history. Family History  Problem Relation Age of Onset  . Cancer Mother   . Mental illness Mother   . Mental illness Father    History  Substance Use Topics  . Smoking status: Current Every Day Smoker -- 1.00 packs/day  . Smokeless tobacco: Never Used  . Alcohol Use: 15.0 oz/week    5 Cans of beer, 20 Shots of liquor per week    Review of Systems A 10 point review of systems was completed and was negative except for pertinent positives and negatives as mentioned in the history of present illness     Allergies  Haldol and  Risperidone and related  Home Medications   Prior to Admission medications   Medication Sig Start Date End Date Taking? Authorizing Provider  escitalopram (LEXAPRO) 10 MG tablet Take 1 tablet (10 mg total) by mouth daily. 03/19/14  Yes Adonis Brook, NP  hydrOXYzine (ATARAX/VISTARIL) 25 MG tablet Take 1 tablet (25 mg total) by mouth at bedtime as needed for anxiety. Patient taking differently: Take 25 mg by mouth 2 (two) times daily as needed for anxiety.  03/19/14  Yes Adonis Brook, NP  QUEtiapine (SEROQUEL) 100 MG tablet Take 150 mg by mouth at bedtime.   Yes Historical Provider, MD  ASPIRIN PO Take 2 tablets by mouth daily.    Historical Provider, MD  folic acid (FOLVITE) 1 MG tablet Take 1 mg by mouth daily.    Historical Provider, MD  traZODone (DESYREL) 50 MG tablet Take 1 tablet (50 mg total) by mouth at bedtime as needed for sleep. For insomnia Patient not taking: Reported on 08/08/2014 03/19/14   Adonis Brook, NP   BP 121/63 mmHg  Pulse 88  Temp(Src) 97.8 F (36.6 C) (Oral)  Resp 20  Ht  (1.803 m)  Wt 155 lb (70.308 kg)  BMI 21.63 kg/m2  SpO2 99% Physical Exam  Constitutional: He is oriented to person, place, and time. He appears well-developed and well-nourished.  HENT:  Head: Normocephalic and atraumatic.  Mouth/Throat: Oropharynx is clear  and moist.  Eyes: Conjunctivae are normal. Pupils are equal, round, and reactive to light. Right eye exhibits no discharge. Left eye exhibits no discharge. No scleral icterus.  Neck: Neck supple.  Cardiovascular: Normal rate, regular rhythm and normal heart sounds.   Pulmonary/Chest: Effort normal and breath sounds normal. No respiratory distress. He has no wheezes. He has no rales.  Abdominal: Soft. There is no tenderness.  Musculoskeletal: He exhibits no tenderness.  Neurological: He is alert and oriented to person, place, and time.  Cranial Nerves II-XII grossly intact  Skin: Skin is warm and dry. No rash noted.   Psychiatric: He has a normal mood and affect. His behavior is normal.  No active hallucinations  Nursing note and vitals reviewed.   ED Course  Procedures (including critical care time) Labs Review Labs Reviewed  ACETAMINOPHEN LEVEL - Abnormal; Notable for the following:    Acetaminophen (Tylenol), Serum <10.0 (*)    All other components within normal limits  CBC - Abnormal; Notable for the following:    WBC 11.1 (*)    Hemoglobin 9.9 (*)    HCT 32.6 (*)    MCV 73.3 (*)    MCH 22.2 (*)    RDW 22.1 (*)    All other components within normal limits  COMPREHENSIVE METABOLIC PANEL - Abnormal; Notable for the following:    Anion gap 4 (*)    All other components within normal limits  ETHANOL  SALICYLATE LEVEL  URINE RAPID DRUG SCREEN (HOSP PERFORMED)    Imaging Review No results found.   EKG Interpretation None     Meds given in ED:  Medications - No data to display  New Prescriptions   No medications on file   Filed Vitals:   08/15/14 1631  BP: 121/63  Pulse: 88  Temp: 97.8 F (36.6 C)  TempSrc: Oral  Resp: 20  Height: 5\' 11"  (1.803 m)  Weight: 155 lb (70.308 kg)  SpO2: 99%    MDM  Vitals stable - WNL -afebrile Pt resting comfortably in ED. PE--physical exam is benign and noncontributory. Labwork noncontributory. UDS negative  Patient is nonviolent, with no suicidal or homicidal ideations. Patient is medically cleared at this point [facility]  I discussed all relevant lab findings and imaging results with pt and they verbalized understanding. Discussed f/u with PCP within 48 hrs and return precautions, pt very amenable to plan.  Prior to patient discharge, I discussed and reviewed this case with Dr.Docherty    Final diagnoses:  Hallucinations       Joycie PeekBenjamin Doyel Mulkern, PA-C 08/15/14 2049  Toy CookeyMegan Docherty, MD 08/16/14 [id number]

## 2014-08-15 NOTE — ED Notes (Signed)
[facility] called to advise that this patient is medically cleared to see them as requested by PA Cartner, [facility] states she needs to speak with administrator in regards to patient being medically cleared and asked if this nurse could call her back in aboug 15 minutes for further instructions on what patient needs to do to be seen there.

## 2014-08-15 NOTE — ED Notes (Signed)
[facility] here to pick up patient.

## 2014-08-15 NOTE — Discharge Instructions (Signed)
Your evaluation the ED today is complete and there does not appear to be any emergent causes for your symptoms at this time. You have been medically cleared for services to be performed by day Loraine Leriche a [employer]. Return to ED for worsening symptoms.   Emergency Department Resource Guide 1) Find a Doctor and Pay Out of Pocket Although you won't have to find out who is covered by your insurance plan, it is a good idea to ask around and get recommendations. You will then need to call the office and see if the doctor you have chosen will accept you as a new patient and what types of options they offer for patients who are self-pay. Some doctors offer discounts or will set up payment plans for their patients who do not have insurance, but you will need to ask so you aren't surprised when you get to your appointment.  2) Contact Your Local Health Department Not all health departments have doctors that can see patients for sick visits, but many do, so it is worth a call to see if yours does. If you don't know where your local health department is, you can check in your phone book. The CDC also has a tool to help you locate your state's health department, and many state websites also have listings of all of their local health departments.  3) Find a Walk-in Clinic If your illness is not likely to be very severe or complicated, you may want to try a walk in clinic. These are popping up all over the country in pharmacies, drugstores, and shopping centers. They're usually staffed by nurse practitioners or physician assistants that have been trained to treat common illnesses and complaints. They're usually fairly quick and inexpensive. However, if you have serious medical issues or chronic medical problems, these are probably not your best option.  No Primary Care Doctor: - Call Health Connect at  [phone] - they can help you locate a primary care doctor that  accepts your insurance, provides  certain services, etc. - Physician Referral Service- [phone]  Chronic Pain Problems: Organization         Address  Phone   Notes  [facility]  [phone] Patients need to be referred by their primary care doctor.   Medication Assistance: Organization         Address  Phone   Notes  [facility] Medication [facility] [address] [place], [place] [address] [phone] --Must be a resident of [facility] -- Must have NO insurance coverage whatsoever (no Medicaid/ Medicare, etc.) -- The pt. MUST have a primary care doctor that directs their care regularly and follows them in the community   MedAssist  [phone]   [employer]  [phone]    Agencies that provide inexpensive medical care: Organization         Address  Phone   Notes  [facility]  [phone]   [facility]    [phone]   [facility] [address] [place], [place] [address] [phone]   [facility] [address], [place] [phone]   Planned Parenthood    [phone]   [facility]    [phone]   Community Health and [facility]  [address], [place] Phone:  [phone], Fax:  [phone] Hours of Operation:  9 am - 6 pm,  M-F.  Also accepts Medicaid/Medicare and self-pay.  [facility]  [address], [place] Phone: [phone], Fax: [phone]. Hours of Operation:  8:30 am - 5:30 pm, M-F.  Also accepts Medicaid and self-pay.  [facility] [place] [address], [place] Phone: [phone]) [phone]   [facility] [address], [place], [place] [phone], Ext. [phone] Mondays & Thursdays: 7-9 AM.  First 15 patients are seen on a first come, first serve basis.    Medicaid-accepting [facility] Providers:  Organization         Address  Phone   Notes  [facility] [address], [place] [phone]) [phone] Also accepts self-pay patients.  [facility] [address], [place]  [phone]) [phone]   [facility] [address], [place] [phone]) [phone]   [facility] [address] [place] [address], [place] [phone]) [phone]   Renaye RakersVeita Bland [address], [place]   [phone] Only accepts [place] Access [place]icaid patients after they have their name applied to their card.   Self-Pay (no insurance) in [facility]:  Organization         Address  Phone   Notes  Sickle Cell Patients, [facility] [address], [place] [phone]) [phone]   [facility] [address], [place] [phone]) [phone]   [facility]  [address], [place] [phone](336) [phone]   [facility]/Dr. Osei-Bonsu  [address] [place] [address], [place] or [phone], [place] [phone] Phone number for both [place] and [place] locations is the same.  [facility]gent [facility] and [facility] [address], [place] [phone]) [phone]   [facility] [address] [place] [address], [place] or [address] [phone](336) [phone]   [facility] [address], [place] [phone]) [phone], phone; [phone], fax Sees patients 1st and 3rd Saturday of every month.  Must not qualify for public or private insurance (i.e. Medicaid, Medicare, [place] Health Choice, Veterans' Benefits)  Household income should be no more than 200% of the poverty level The clinic cannot treat you if you are pregnant or think you are pregnant  Sexually transmitted diseases are not treated at the clinic.    Dental Care: Organization         Address  Phone  Notes  [facility] of [facility] [address], [place] [phone]  Accepts children up to age 43 who are enrolled in [place]icaid or [place] Health Choice; pregnant women with a Medicaid card; and children who have applied for Medicaid or [place] Health Choice, but were declined, whose parents can pay a reduced fee at time of service.  [facility] Department of [facility] [place]  [address], [place] [phone]) [phone] Accepts children up to age 43 who are enrolled in [place]icaid or [place] Health Choice; pregnant women with a Medicaid card; and children who have applied for Medicaid or [place] Health Choice, but were declined, whose parents can pay a reduced fee at time of service.  [facility]  [address], [place] [phone]) [phone] Patients are seen by appointment only. Walk-ins are not accepted. [facility] will see patients 43 years of age and older. Monday -  Tuesday (8am-5pm) Most Wednesdays (8:30-5pm) $30 per visit, cash only  [facility]  [address], [facility] [phone]) [phone] Patients are seen by appointment only. Walk-ins are not accepted. [facility] will see patients 43 years of age and older. One Wednesday Evening (Monthly: Volunteer Based).  $30 per visit, cash only  [employer] [facility]  [phone] for adults; Children under age 954, call Graduate Pediatric Dentistry at [phone]) [phone]. Children aged 274-14, please call [phone] to request a pediatric application.  Dental services are provided in all areas of dental care including fillings, crowns and bridges, complete and partial dentures, implants, gum treatment, root canals, and extractions. Preventive care is also provided. Treatment is provided to both adults and children. Patients are selected via a lottery and there is often a waiting list.   [facility] [address], [place]  [phone]) [phone] [email or web address]   [facility] [address], [place], [place] [phone], Ext. [phone] Second  and Fourth Thursday of each month, opens at 6:30 AM; Clinic ends at 9 AM.  Patients are seen on a first-come first-served basis, and a limited number are seen during each clinic.   [facility]  [address], [place], [place] [phone]) [phone]   Eligibility Requirements You must have lived in [place], [place], or [place] counties for at least the last three months.   You cannot be eligible for state or federal sponsored National Cityhealthcare insurance, including CIGNAVeterans Administration, [place]icaid, or [employer] EntertainmentMedicare.   You generally cannot be eligible for healthcare insurance through your employer.    How to apply: Eligibility screenings are held every Tuesday and Wednesday afternoon from 1:00 pm until 4:00 pm. You do not need an appointment for the interview!  [facility] [address], [place], [place] [phone]   [facility]  [phone]   [facility]  [phone]   [facility]  [phone]    Behavioral Health Resources in the Community: Intensive Outpatient Programs Organization         Address  Phone  Notes  [facility] [address], [place], [place] [phone]   [facility] [address], [place], [place] [phone]   [facility]: [facility] [address], [place], [place]  [phone]   [facility] [address],  [place], [place] [phone] or [phone]   Substance Abuse Resources Organization         Address  Phone  Notes  [facility]  [phone]   [facility]  [phone]   [facility]  [phone]   [facility]  [phone]   Residential & Outpatient Substance Abuse Program  [phone]   Psychological Services Organization         Address  Phone  Notes  [facility]  [phone]- [phone]   [facility]  [phone]- [phone]   [facility] [address], [place] [phone] or [phone]    Mobile Crisis Teams Organization         Address  Phone  Notes  [facility], Mobile Crisis Care Unit  [phone]   [facility]  [address] [place], [place] [phone]   Doristine LocksSharon DeEsch [address] [place] [phone]    Self-Help/Support Groups Organization         Address  Phone  Notes  Mental Health Assoc. of [place] - variety of support groups  [place] Call for more information  Narcotics Anonymous (NA), Caring Services [address], [employer] [place]  2 meetings at this location   Special educational needs teacher         Address  Phone  Notes  ASAP Residential Treatment [place],    [place]  [phone]   [facility]  [address], [place] [address]90, [place], [place]   [place], [place] [phone] Admissions: 8am-3pm M-F  Incentives Substance [place] [address],    [place], [place] [phone]   [facility] [address] [place], [place], [place]   [facility] [address]  [place], [place]   Insight Programs - Intensive Outpatient [address], [place], [place]   [facility] ([place] [address].,  [place], [place] [phone] or [phone]   [facility] (RTS) [address] [place], [place] Accepts Medicaid  [facility] [address],  [place] [phone] Substance Abuse/Addiction Treatment   [facility] Organization         Address  Phone  Notes  [facility]  [phone]   Domenic Schwab, PhD [address] [place], [place]   [phone] or [phone]   [place], [place]  [phone]   [facility] [address], [place], [place] [phone] Insurance/Medicaid/sponsorship through [facility] and Families [address], [address]                                    [place], [place] [phone] [place] [address] [place], [place] [phone]    Dr. Adele Schilder  [phone]   [facility] Dept. 1) [address], [place] 2) [place] 3)  [place] [address], [place] [phone]  [phone]   [place] [phone] or [phone] (After Hours)

## 2014-08-15 NOTE — ED Notes (Signed)
[facility]. They are just voices in his head he says. He has been hearing them since he was [age]. [facility] states he can come back there after he is medically cleared. Pt denies the voices are telling him to hurt himself or anyone else.

## 2014-08-22 ENCOUNTER — Encounter (HOSPITAL_BASED_OUTPATIENT_CLINIC_OR_DEPARTMENT_OTHER): Payer: Self-pay | Admitting: *Deleted

## 2014-08-22 ENCOUNTER — Emergency Department (HOSPITAL_BASED_OUTPATIENT_CLINIC_OR_DEPARTMENT_OTHER)
Admission: EM | Admit: 2014-08-22 | Discharge: 2014-08-22 | Disposition: A | Discharge status: Home / Self Care | Payer: Self-pay | Attending: Emergency Medicine | Admitting: Emergency Medicine

## 2014-08-22 DIAGNOSIS — R011 Cardiac murmur, unspecified: Secondary | ICD-10-CM | POA: Insufficient documentation

## 2014-08-22 DIAGNOSIS — Z7982 Long term (current) use of aspirin: Secondary | ICD-10-CM | POA: Insufficient documentation

## 2014-08-22 DIAGNOSIS — Z720 Tobacco use: Secondary | ICD-10-CM | POA: Insufficient documentation

## 2014-08-22 DIAGNOSIS — Z862 Personal history of diseases of the blood and blood-forming organs and certain disorders involving the immune mechanism: Secondary | ICD-10-CM | POA: Insufficient documentation

## 2014-08-22 DIAGNOSIS — K029 Dental caries, unspecified: Secondary | ICD-10-CM | POA: Insufficient documentation

## 2014-08-22 DIAGNOSIS — F410 Panic disorder [episodic paroxysmal anxiety] without agoraphobia: Secondary | ICD-10-CM | POA: Insufficient documentation

## 2014-08-22 DIAGNOSIS — Z79899 Other long term (current) drug therapy: Secondary | ICD-10-CM | POA: Insufficient documentation

## 2014-08-22 DIAGNOSIS — K088 Other specified disorders of teeth and supporting structures: Secondary | ICD-10-CM | POA: Insufficient documentation

## 2014-08-22 DIAGNOSIS — I10 Essential (primary) hypertension: Secondary | ICD-10-CM | POA: Insufficient documentation

## 2014-08-22 DIAGNOSIS — K0889 Other specified disorders of teeth and supporting structures: Secondary | ICD-10-CM

## 2014-08-22 MED ORDER — NAPROXEN 500 MG PO TABS: 500.0000 mg | ORAL_TABLET | Freq: Two times a day (BID) | ORAL | Status: DC

## 2014-08-22 MED ORDER — PENICILLIN V POTASSIUM 500 MG PO TABS: 500.0000 mg | ORAL_TABLET | Freq: Four times a day (QID) | ORAL | Status: DC

## 2014-08-22 NOTE — ED Notes (Signed)
Pt amb to room 6 with quick steady gait in nad. Pt reports tooth pain "for years" worse this am. Does not have dentist or dental insurance.

## 2014-08-22 NOTE — ED Provider Notes (Signed)
CSN: [id number]     Arrival date & time 08/22/14  [id number] History   First MD Initiated Contact with Patient 08/22/14 [phone]     Chief Complaint  Patient presents with  . Dental Pain     (Consider location/radiation/quality/duration/timing/severity/associated sxs/prior Treatment) Patient is a 44 y.o. male presenting with tooth pain. The history is provided by the patient.  Dental Pain Associated symptoms: no congestion, no fever, no headaches and no neck pain    Patient with history of substance abuse currently [facility] abuse. Patient with complaint of left upper tooth pain. Been bothering him for years but much worse in last few days. Patient is not allergic to the penicillin. [facility] is still at [facility]. Patient states the pain is 8 out of 10. Patient was sleeping when I entered the room. Past Medical History  Diagnosis Date  . ETOH abuse   . Panic attack   . Hypertension   . Heart murmur   . Thalassemia    History reviewed. No pertinent past surgical history. Family History  Problem Relation Age of Onset  . Cancer Mother   . Mental illness Mother   . Mental illness Father    History  Substance Use Topics  . Smoking status: Current Every Day Smoker -- 1.00 packs/day  . Smokeless tobacco: Never Used  . Alcohol Use: 15.0 oz/week    5 Cans of beer, 20 Shots of liquor per week    Review of Systems  Constitutional: Negative for fever.  HENT: Positive for dental problem. Negative for congestion and trouble swallowing.   Eyes: Negative for redness.  Respiratory: Negative for shortness of breath.   Cardiovascular: Negative for chest pain.  Gastrointestinal: Negative for abdominal pain.  Genitourinary: Negative for dysuria.  Musculoskeletal: Negative for neck pain.  Neurological: Negative for headaches.  Hematological: Does not bruise/bleed easily.  Psychiatric/Behavioral: Negative for confusion.      Allergies  Haldol and Risperidone and  related  Home Medications   Prior to Admission medications   Medication Sig Start Date End Date Taking? Authorizing Provider  asenapine (SAPHRIS) 5 MG SUBL 24 hr tablet Place 10 mg under the tongue 2 (two) times daily.   Yes Historical Provider, MD  ASPIRIN PO Take 2 tablets by mouth daily.    Historical Provider, MD  escitalopram (LEXAPRO) 10 MG tablet Take 1 tablet (10 mg total) by mouth daily. 03/19/14   Adonis BrookSheila Agustin, NP  folic acid (FOLVITE) 1 MG tablet Take 1 mg by mouth daily.    Historical Provider, MD  hydrOXYzine (ATARAX/VISTARIL) 25 MG tablet Take 1 tablet (25 mg total) by mouth at bedtime as needed for anxiety. Patient taking differently: Take 25 mg by mouth 2 (two) times daily as needed for anxiety.  03/19/14   Adonis BrookSheila Agustin, NP  naproxen (NAPROSYN) 500 MG tablet Take 1 tablet (500 mg total) by mouth 2 (two) times daily. 08/22/14   Vanetta MuldersScott Sarea Fyfe, MD  penicillin v potassium (VEETID) 500 MG tablet Take 1 tablet (500 mg total) by mouth 4 (four) times daily. 08/22/14   Vanetta MuldersScott Eryn Marandola, MD  QUEtiapine (SEROQUEL) 100 MG tablet Take 150 mg by mouth at bedtime.    Historical Provider, MD  traZODone (DESYREL) 50 MG tablet Take 1 tablet (50 mg total) by mouth at bedtime as needed for sleep. For insomnia Patient not taking: Reported on 08/08/2014 03/19/14   Adonis BrookSheila Agustin, NP   BP 154/76 mmHg  Pulse 92  Temp(Src) 97.7 F (36.5 C) (  Oral)  Resp 18  SpO2 96% Physical Exam  Constitutional: He is oriented to person, place, and time. He appears well-developed and well-nourished. No distress.  HENT:  Head: Normocephalic and atraumatic.  Mouth/Throat: Oropharynx is clear and moist.  Marked decay and tenderness to left upper molar. No floor the mouth swelling.  Eyes: Conjunctivae and EOM are normal. Pupils are equal, round, and reactive to light.  Neck: Normal range of motion. Neck supple.  Cardiovascular: Normal rate, regular rhythm and normal heart sounds.   No murmur  heard. Pulmonary/Chest: Effort normal and breath sounds normal. No respiratory distress.  Abdominal: Soft. Bowel sounds are normal. There is no tenderness.  Neurological: He is alert and oriented to person, place, and time. No cranial nerve deficit. He exhibits normal muscle tone. Coordination normal.  Skin: Skin is warm.  Nursing note and vitals reviewed.   ED Course  Procedures (including critical care time) Labs Review Labs Reviewed - No data to display  Imaging Review No results found.   EKG Interpretation None      MDM   Final diagnoses:  Toothache    Patient with marked decay to left upper molar. No swelling to the floor the mouth. Patient sleeping when I went in the room. Patient was treated with penicillin and Naprosyn. [facility] is currently at [facility]. Referral to [facility] provided.   Vanetta Mulders, MD 08/22/14 [phone]

## 2014-08-22 NOTE — Discharge Instructions (Signed)
Dental Pain Toothache is pain in or around a tooth. It may get worse with chewing or with cold or heat.  HOME CARE  Your dentist may use a numbing medicine during treatment. If so, you may need to avoid eating until the medicine wears off. Ask your dentist about this.  Only take medicine as told by your dentist or doctor.  Avoid chewing food near the painful tooth until after all treatment is done. Ask your dentist about this. GET HELP RIGHT AWAY IF:   The problem gets worse or new problems appear.  You have a fever.  There is redness and puffiness (swelling) of the face, jaw, or neck.  You cannot open your mouth.  There is pain in the jaw.  There is very bad pain that is not helped by medicine. MAKE SURE YOU:   Understand these instructions.  Will watch your condition.  Will get help right away if you are not doing well or get worse. Document Released: 10/28/2007 Document Revised: 08/03/2011 Document Reviewed: 10/28/2007 [facility] Patient Information 2015 [place], [place]. This information is not intended to replace advice given to you by your health care provider. Make sure you discuss any questions you have with your health care provider.  Dental referral information provided. Take antibiotic as directed for the next 7 days. Take the anti-inflammatory medicine Naprosyn as directed as well. Follow-up with dentist. Return for any new or worse symptoms.

## 2018-01-24 ENCOUNTER — Emergency Department (HOSPITAL_COMMUNITY)
Admission: EM | Admit: 2018-01-24 | Discharge: 2018-01-24 | Disposition: A | Discharge status: Home / Self Care | Payer: Medicaid Other | Attending: Emergency Medicine | Admitting: Emergency Medicine

## 2018-01-24 ENCOUNTER — Encounter (HOSPITAL_COMMUNITY): Payer: Self-pay | Admitting: Emergency Medicine

## 2018-01-24 ENCOUNTER — Emergency Department (HOSPITAL_COMMUNITY)
Admission: EM | Admit: 2018-01-24 | Discharge: 2018-01-25 | Disposition: A | Discharge status: Other Acute Inpatient Hospital | Payer: Medicaid Other | Source: Home / Self Care | Attending: Emergency Medicine | Admitting: Emergency Medicine

## 2018-01-24 DIAGNOSIS — Z7982 Long term (current) use of aspirin: Secondary | ICD-10-CM | POA: Insufficient documentation

## 2018-01-24 DIAGNOSIS — I10 Essential (primary) hypertension: Secondary | ICD-10-CM

## 2018-01-24 DIAGNOSIS — Z79899 Other long term (current) drug therapy: Secondary | ICD-10-CM | POA: Insufficient documentation

## 2018-01-24 DIAGNOSIS — F17200 Nicotine dependence, unspecified, uncomplicated: Secondary | ICD-10-CM

## 2018-01-24 DIAGNOSIS — T50901A Poisoning by unspecified drugs, medicaments and biological substances, accidental (unintentional), initial encounter: Secondary | ICD-10-CM | POA: Diagnosis present

## 2018-01-24 DIAGNOSIS — F333 Major depressive disorder, recurrent, severe with psychotic symptoms: Secondary | ICD-10-CM | POA: Insufficient documentation

## 2018-01-24 DIAGNOSIS — F10920 Alcohol use, unspecified with intoxication, uncomplicated: Secondary | ICD-10-CM | POA: Diagnosis not present

## 2018-01-24 DIAGNOSIS — F1010 Alcohol abuse, uncomplicated: Secondary | ICD-10-CM

## 2018-01-24 DIAGNOSIS — R4182 Altered mental status, unspecified: Secondary | ICD-10-CM | POA: Insufficient documentation

## 2018-01-24 DIAGNOSIS — F17210 Nicotine dependence, cigarettes, uncomplicated: Secondary | ICD-10-CM | POA: Diagnosis not present

## 2018-01-24 DIAGNOSIS — R45851 Suicidal ideations: Secondary | ICD-10-CM

## 2018-01-24 LAB — COMPREHENSIVE METABOLIC PANEL
ALBUMIN: 4 g/dL (ref 3.5–5.0)
ALK PHOS: 64 U/L (ref 38–126)
ALT: 18 U/L (ref 0–44)
ANION GAP: 15 (ref 5–15)
AST: 33 U/L (ref 15–41)
BILIRUBIN TOTAL: 1.8 mg/dL — AB (ref 0.3–1.2)
BUN: 6 mg/dL (ref 6–20)
CO2: 22 mmol/L (ref 22–32)
Calcium: 8.8 mg/dL — ABNORMAL LOW (ref 8.9–10.3)
Chloride: 101 mmol/L (ref 98–111)
Creatinine, Ser: 0.77 mg/dL (ref 0.61–1.24)
GFR calc non Af Amer: >60 mL/min (ref >60)
Glucose, Bld: 109 mg/dL — ABNORMAL HIGH (ref 70–99)
Potassium: 2.9 mmol/L — ABNORMAL LOW (ref 3.5–5.1)
Sodium: 138 mmol/L (ref 135–145)
TOTAL PROTEIN: 6.9 g/dL (ref 6.5–8.1)

## 2018-01-24 LAB — CBC
HCT: 34 % — ABNORMAL LOW (ref 39.0–52.0)
Hemoglobin: 10.6 g/dL — ABNORMAL LOW (ref 13.0–17.0)
MCH: 21.6 pg — ABNORMAL LOW (ref 26.0–34.0)
MCHC: 31.2 g/dL (ref 30.0–36.0)
MCV: 69.4 fL — ABNORMAL LOW (ref 78.0–100.0)
Platelets: 252 10*3/uL (ref 150–400)
RBC: 4.9 MIL/uL (ref 4.22–5.81)
RDW: 16.9 % — ABNORMAL HIGH (ref 11.5–15.5)
WBC: 9.1 10*3/uL (ref 4.0–10.5)

## 2018-01-24 LAB — RAPID URINE DRUG SCREEN, HOSP PERFORMED
AMPHETAMINES: NOT DETECTED
BARBITURATES: NOT DETECTED
BENZODIAZEPINES: POSITIVE — AB
COCAINE: POSITIVE — AB
OPIATES: POSITIVE — AB
Tetrahydrocannabinol: NOT DETECTED

## 2018-01-24 LAB — URINALYSIS, ROUTINE W REFLEX MICROSCOPIC
BILIRUBIN URINE: NEGATIVE
Glucose, UA: NEGATIVE mg/dL
Hgb urine dipstick: NEGATIVE
KETONES UR: NEGATIVE mg/dL
Nitrite: NEGATIVE
PH: 5 (ref 5.0–8.0)
PROTEIN: NEGATIVE mg/dL
Specific Gravity, Urine: 1.016 (ref 1.005–1.030)

## 2018-01-24 LAB — MAGNESIUM: Magnesium: 1.9 mg/dL (ref 1.7–2.4)

## 2018-01-24 LAB — SALICYLATE LEVEL: Salicylate Lvl: <7 mg/dL (ref 2.8–30.0)

## 2018-01-24 LAB — ETHANOL: ALCOHOL ETHYL (B): 201 mg/dL — AB (ref <10)

## 2018-01-24 LAB — ACETAMINOPHEN LEVEL: Acetaminophen (Tylenol), Serum: <10 ug/mL — ABNORMAL LOW (ref 10–30)

## 2018-01-24 MED ORDER — POTASSIUM CHLORIDE CRYS ER 20 MEQ PO TBCR: 40.0000 meq | EXTENDED_RELEASE_TABLET | Freq: Once | ORAL | Status: DC

## 2018-01-24 MED ORDER — POTASSIUM CHLORIDE 10 MEQ/100ML IV SOLN: 10.0000 meq | INTRAVENOUS | Status: AC

## 2018-01-24 MED ORDER — VITAMIN B-1 100 MG PO TABS
100.0000 mg | ORAL_TABLET | Freq: Every day | ORAL | Status: DC
  Administered 2018-01-24 – 2018-01-25 (×2): 100 mg via ORAL
  Filled 2018-01-24 (×2): qty 1

## 2018-01-24 MED ORDER — LORAZEPAM 2 MG/ML IJ SOLN: 0.0000 mg | Freq: Four times a day (QID) | INTRAMUSCULAR | Status: DC

## 2018-01-24 MED ORDER — LORAZEPAM 1 MG PO TABS
0.0000 mg | ORAL_TABLET | Freq: Four times a day (QID) | ORAL | Status: DC
  Administered 2018-01-24 – 2018-01-25 (×3): 1 mg via ORAL
  Filled 2018-01-24 (×3): qty 1

## 2018-01-24 MED ORDER — ONDANSETRON HCL 4 MG PO TABS
4.0000 mg | ORAL_TABLET | Freq: Three times a day (TID) | ORAL | Status: DC | PRN
  Administered 2018-01-24: 4 mg via ORAL
  Filled 2018-01-24: qty 1

## 2018-01-24 MED ORDER — LORAZEPAM 2 MG/ML IJ SOLN: 0.0000 mg | Freq: Two times a day (BID) | INTRAMUSCULAR | Status: DC

## 2018-01-24 MED ORDER — ALUM & MAG HYDROXIDE-SIMETH 200-200-20 MG/5ML PO SUSP: 30.0000 mL | Freq: Four times a day (QID) | ORAL | Status: DC | PRN

## 2018-01-24 MED ORDER — THIAMINE HCL 100 MG/ML IJ SOLN: 100.0000 mg | Freq: Every day | INTRAMUSCULAR | Status: DC

## 2018-01-24 MED ORDER — LORAZEPAM 1 MG PO TABS: 0.0000 mg | ORAL_TABLET | Freq: Two times a day (BID) | ORAL | Status: DC

## 2018-01-24 NOTE — ED Provider Notes (Signed)
[facility] EMERGENCY DEPARTMENT Provider Note   CSN: [id number] Arrival date & time: 01/24/18  0216     History   Chief Complaint Chief Complaint  Patient presents with  . Drug Overdose    HPI Arthur [name] is a 47 y.o. male.  Patient presents to the ER for mental status changes.  He was reportedly being transported to behavioral health when he became unresponsive.  EMS was called and found him with garbled speech, decreased responsiveness.  He was given Narcan but did not have significant response.  At arrival to the ER he is somnolent but intermittently becomes agitated, answers questions.  Speech is slurred. Level V Caveat due to AMS.     Past Medical History:  Diagnosis Date  . ETOH abuse   . Heart murmur   . Hypertension   . Panic attack   . Thalassemia     Patient Active Problem List   Diagnosis Date Noted  . Alcohol use disorder, severe, dependence (HCC) 08/09/2014  . Alcohol abuse 03/15/2014  . Thalassemia     History reviewed. No pertinent surgical history.      Home Medications    Prior to Admission medications   Medication Sig Start Date End Date Taking? Authorizing Provider  asenapine (SAPHRIS) 5 MG SUBL 24 hr tablet Place 10 mg under the tongue 2 (two) times daily.    [provider]  ASPIRIN PO Take 2 tablets by mouth daily.    [provider]  escitalopram (LEXAPRO) 10 MG tablet Take 1 tablet (10 mg total) by mouth daily. 03/19/14   Adonis Brook, NP  folic acid (FOLVITE) 1 MG tablet Take 1 mg by mouth daily.    [provider]  hydrOXYzine (ATARAX/VISTARIL) 25 MG tablet Take 1 tablet (25 mg total) by mouth at bedtime as needed for anxiety. Patient taking differently: Take 25 mg by mouth 2 (two) times daily as needed for anxiety.  03/19/14   Adonis Brook, NP  naproxen (NAPROSYN) 500 MG tablet Take 1 tablet (500 mg total) by mouth 2 (two) times daily. 08/22/14   Vanetta Mulders, MD  penicillin v  potassium (VEETID) 500 MG tablet Take 1 tablet (500 mg total) by mouth 4 (four) times daily. 08/22/14   Vanetta Mulders, MD  QUEtiapine (SEROQUEL) 100 MG tablet Take 150 mg by mouth at bedtime.    [provider]  traZODone (DESYREL) 50 MG tablet Take 1 tablet (50 mg total) by mouth at bedtime as needed for sleep. For insomnia Patient not taking: Reported on 08/08/2014 03/19/14   Adonis Brook, NP    Family History Family History  Problem Relation Age of Onset  . Cancer Mother   . Mental illness Mother   . Mental illness Father     Social History Social History   Tobacco Use  . Smoking status: Current Every Day Smoker    Packs/day: 1.00  . Smokeless tobacco: Never Used  Substance Use Topics  . Alcohol use: Yes    Alcohol/week: 25.0 standard drinks    Types: 5 Cans of beer, 20 Shots of liquor per week  . Drug use: Yes    Types: Marijuana     Allergies   Haldol [haloperidol lactate] and Risperidone and related   Review of Systems Review of Systems  Unable to perform ROS: Mental status change     Physical Exam Updated Vital Signs BP 114/64   Pulse (!) 103   Temp (!) 96.9 F (36.1 C) (Oral)  Resp 12   Wt 70.3 kg   SpO2 93%   BMI 21.62 kg/m   Physical Exam  Constitutional: He is oriented to person, place, and time. He appears well-developed and well-nourished. No distress.  HENT:  Head: Normocephalic and atraumatic.  Right Ear: Hearing normal.  Left Ear: Hearing normal.  Nose: Nose normal.  Mouth/Throat: Oropharynx is clear and moist and mucous membranes are normal.  Eyes: Pupils are equal, round, and reactive to light. Conjunctivae and EOM are normal.  Neck: Normal range of motion. Neck supple.  Cardiovascular: Regular rhythm, S1 normal and S2 normal. Tachycardia present. Exam reveals no gallop and no friction rub.  No murmur heard. Pulmonary/Chest: Effort normal and breath sounds normal. No respiratory distress. He exhibits no tenderness.    Abdominal: Soft. Normal appearance and bowel sounds are normal. There is no hepatosplenomegaly. There is no tenderness. There is no rebound, no guarding, no tenderness at McBurney's point and negative Murphy's sign. No hernia.  Musculoskeletal: Normal range of motion.  Neurological: He is alert and oriented to person, place, and time. He has normal strength. No cranial nerve deficit or sensory deficit. Coordination normal. GCS eye subscore is 4. GCS verbal subscore is 5. GCS motor subscore is 6.  Skin: Skin is warm, dry and intact. No rash noted. No cyanosis.  Psychiatric: His affect is angry. His speech is slurred. He is agitated.  Nursing note and vitals reviewed.    ED Treatments / Results  Labs (all labs ordered are listed, but only abnormal results are displayed) Labs Reviewed  COMPREHENSIVE METABOLIC PANEL - Abnormal; Notable for the following components:      Result Value   Potassium 2.9 (*)    Glucose, Bld 109 (*)    Calcium 8.8 (*)    Total Bilirubin 1.8 (*)    All other components within normal limits  ETHANOL - Abnormal; Notable for the following components:   Alcohol, Ethyl (B) 201 (*)    All other components within normal limits  CBC - Abnormal; Notable for the following components:   Hemoglobin 10.6 (*)    HCT 34.0 (*)    MCV 69.4 (*)    MCH 21.6 (*)    RDW 16.9 (*)    All other components within normal limits  ACETAMINOPHEN LEVEL - Abnormal; Notable for the following components:   Acetaminophen (Tylenol), Serum <10 (*)    All other components within normal limits  SALICYLATE LEVEL  RAPID URINE DRUG SCREEN, HOSP PERFORMED  MAGNESIUM    EKG EKG Interpretation  Date/Time:  Monday January 24 2018 02:21:41 EDT Ventricular Rate:  120 PR Interval:    QRS Duration: 114 QT Interval:  354 QTC Calculation: 501 R Axis:   84 Text Interpretation:  Sinus tachycardia Atrial premature complexes Borderline intraventricular conduction delay Nonspecific T abnormalities,  inferior leads Prolonged QT interval Confirmed by Gilda Crease [phone]) on 01/24/2018 2:39:16 AM   Radiology No results found.  Procedures Procedures (including critical care time)  Medications Ordered in ED Medications  potassium chloride 10 mEq in 100 mL IVPB (has no administration in time range)  potassium chloride SA (K-DUR,KLOR-CON) CR tablet 40 mEq (has no administration in time range)     Initial Impression / Assessment and Plan / ED Course  I have reviewed the triage vital signs and the nursing notes.  Pertinent labs & imaging results that were available during my care of the patient were reviewed by me and considered in my medical decision making (see  chart for details).     Patient brought to the emergency department for evaluation of mental status changes.  Patient was reportedly on the way to behavioral health when he became unresponsive.  It is not clear why he was on the way to behavioral health this time.  Patient was given Narcan without improvement.  Upon arrival to the ER he is arousable to voice but is having garbled speech and obvious intoxication.  There was concern over possible Seroquel overdose.  Poison control recommended 6-hour observation, maintaining potassium and magnesium levels.  Potassium is low, administered supplementation.  Patient monitored through the night, still somnolent and altered at this time.  Will sign out to oncoming ER physician to further monitor, reevaluate need for psychiatric evaluation when he is more awake and can provide more information.  Final Clinical Impressions(s) / ED Diagnoses   Final diagnoses:  Altered mental status, unspecified altered mental status type    ED Discharge Orders    None       Greggory Safranek, Canary Brim, MD 01/24/18 [phone]

## 2018-01-24 NOTE — ED Triage Notes (Addendum)
Just released from here this am , states wants to chec k back in, denies SI or HI

## 2018-01-24 NOTE — ED Notes (Signed)
RN AWARE OF VS

## 2018-01-24 NOTE — ED Provider Notes (Signed)
[facility] EMERGENCY DEPARTMENT Provider Note   CSN: [id number] Arrival date & time: 01/24/18  1430     History   Chief Complaint Chief Complaint  Patient presents with  . Medical Clearance    HPI Arthur [name] is a 47 y.o. male.  HPI   47 year old male with past medical history as below including chronic alcoholism here with suicidal ideation.  Patient was just seen overnight for altered mental status.  He reportedly had become altered while being taken to behavioral health for unknown reason.  He was monitored in the ED.  He is notably intoxicated at that time and sobered appropriately.  He states that upon leaving, he felt somewhat better.  However, he states that the original reason he wanted to be seen was because he is depressed and is having suicidal ideation.  He states that he thinks about killing himself but is unable to provide details regarding a specific plan.  He said worsening depression for the last several weeks to months.  He has been using multiple drugs as well as drinking heavily to self medicate.  He states he has been previously admitted for this.  He also has a history of withdrawals, and states he feels somewhat shaky today.  His last drink was approximately 12 to 16 hours ago.  Denies any hallucinations.  No pain. No urinary sx.  Past Medical History:  Diagnosis Date  . ETOH abuse   . Heart murmur   . Hypertension   . Panic attack   . Thalassemia     Patient Active Problem List   Diagnosis Date Noted  . Alcohol use disorder, severe, dependence (HCC) 08/09/2014  . Alcohol abuse 03/15/2014  . Thalassemia     No past surgical history on file.      Home Medications    Prior to Admission medications   Medication Sig Start Date End Date Taking? Authorizing Provider  busPIRone (BUSPAR) 30 MG tablet Take 30 mg by mouth 2 (two) times daily.    [provider]  escitalopram (LEXAPRO) 10 MG tablet Take 1 tablet (10 mg  total) by mouth daily. Patient not taking: Reported on 01/24/2018 03/19/14   Adonis Brook, NP  folic acid (FOLVITE) 1 MG tablet Take 1 mg by mouth daily.    [provider]  hydrOXYzine (ATARAX/VISTARIL) 25 MG tablet Take 1 tablet (25 mg total) by mouth at bedtime as needed for anxiety. Patient not taking: Reported on 01/24/2018 03/19/14   Adonis Brook, NP  lisdexamfetamine (VYVANSE) 40 MG capsule Take 40 mg by mouth every morning.    [provider]  montelukast (SINGULAIR) 10 MG tablet Take 10 mg by mouth daily.    [provider]  naproxen (NAPROSYN) 500 MG tablet Take 1 tablet (500 mg total) by mouth 2 (two) times daily. Patient not taking: Reported on 01/24/2018 08/22/14   Vanetta Mulders, MD  QUEtiapine (SEROQUEL) 400 MG tablet Take 800 mg by mouth at bedtime.     [provider]  traZODone (DESYREL) 50 MG tablet Take 1 tablet (50 mg total) by mouth at bedtime as needed for sleep. For insomnia Patient not taking: Reported on 08/08/2014 03/19/14   Adonis Brook, NP    Family History Family History  Problem Relation Age of Onset  . Cancer Mother   . Mental illness Mother   . Mental illness Father     Social History Social History   Tobacco Use  . Smoking status: Current Every Day  Smoker    Packs/day: 1.00  . Smokeless tobacco: Never Used  Substance Use Topics  . Alcohol use: Yes    Alcohol/week: 25.0 standard drinks    Types: 5 Cans of beer, 20 Shots of liquor per week  . Drug use: Yes    Types: Marijuana     Allergies   Haldol [haloperidol lactate] and Risperidone and related   Review of Systems Review of Systems  Constitutional: Positive for fatigue. Negative for chills and fever.  HENT: Negative for congestion and rhinorrhea.   Eyes: Negative for visual disturbance.  Respiratory: Negative for cough, shortness of breath and wheezing.   Cardiovascular: Negative for chest pain and leg swelling.  Gastrointestinal: Negative for  abdominal pain, diarrhea, nausea and vomiting.  Genitourinary: Negative for dysuria and flank pain.  Musculoskeletal: Negative for neck pain and neck stiffness.  Skin: Negative for rash and wound.  Allergic/Immunologic: Negative for immunocompromised state.  Neurological: Negative for syncope, weakness and headaches.  Psychiatric/Behavioral: Positive for dysphoric mood and suicidal ideas.  All other systems reviewed and are negative.    Physical Exam Updated Vital Signs BP 140/69   Pulse (!) 106   Temp 98.2 F (36.8 C) (Oral)   Resp 20   SpO2 98%   Physical Exam  Constitutional: He is oriented to person, place, and time. He appears well-developed and well-nourished. No distress.  HENT:  Head: Normocephalic and atraumatic.  Mouth/Throat: Oropharynx is clear and moist.  Eyes: Conjunctivae are normal.  Neck: Neck supple.  Cardiovascular: Normal rate, regular rhythm and normal heart sounds. Exam reveals no friction rub.  No murmur heard. Pulmonary/Chest: Effort normal and breath sounds normal. No respiratory distress. He has no wheezes. He has no rales.  Abdominal: He exhibits no distension.  Musculoskeletal: He exhibits no edema.  Neurological: He is alert and oriented to person, place, and time. He exhibits normal muscle tone.  Skin: Skin is warm. Capillary refill takes less than 2 seconds.  Psychiatric: He has a normal mood and affect. He expresses suicidal ideation.  Nursing note and vitals reviewed.    ED Treatments / Results  Labs (all labs ordered are listed, but only abnormal results are displayed) Labs Reviewed  URINALYSIS, ROUTINE W REFLEX MICROSCOPIC - Abnormal; Notable for the following components:      Result Value   Color, Urine AMBER (*)    Leukocytes, UA SMALL (*)    Bacteria, UA FEW (*)    All other components within normal limits  URINE CULTURE  RAPID URINE DRUG SCREEN, HOSP PERFORMED    EKG None  Radiology No results  found.  Procedures Procedures (including critical care time)  Medications Ordered in ED Medications  LORazepam (ATIVAN) injection 0-4 mg ( Intravenous See Alternative 01/24/18 2032)    Or  LORazepam (ATIVAN) tablet 0-4 mg (1 mg Oral Given 01/24/18 2032)  thiamine (VITAMIN B-1) tablet 100 mg (100 mg Oral Given 01/24/18 2032)    Or  thiamine (B-1) injection 100 mg ( Intravenous See Alternative 01/24/18 2032)  ondansetron (ZOFRAN) tablet 4 mg (4 mg Oral Given 01/24/18 2032)  alum & mag hydroxide-simeth (MAALOX/MYLANTA) 200-200-20 MG/5ML suspension 30 mL (has no administration in time range)  potassium chloride SA (K-DUR,KLOR-CON) CR tablet 40 mEq (has no administration in time range)     Initial Impression / Assessment and Plan / ED Course  I have reviewed the triage vital signs and the nursing notes.  Pertinent labs & imaging results that were available during my care of  the patient were reviewed by me and considered in my medical decision making (see chart for details).     47 yo M here with suicidal ideation. He was seen overnight, noted to be intoxicated and sent home this AM. On arrival here today, pt with active SI. Question of primary depression, EtOH abuse, or secondary gain. I reviewed his labs from recent ED visit overnight - he had mild hypoK that has been replaced. UA with 6-10 WBCs but no urinary sx, doubt this is UTI. Will replace K, send UCx. Pt clear for Psych eval.  Final Clinical Impressions(s) / ED Diagnoses   Final diagnoses:  Suicidal ideation  Alcohol abuse    ED Discharge Orders    None       Shaune Pollack, MD 01/24/18 2344
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
## 2018-01-24 NOTE — ED Notes (Signed)
ED provider at bedside.  Pt is talking however lethargic and unable to keep eyes open during assessment.  Pt is unable to provide answers to questions as he is using repetitive phrases including "give me some water" "give me water now."  Pt is raising his voice and yelling at all clinical staff and reminded to be polite and not use foul language as other patients deserve a quiet atmosphere.  Pt denies using heroin and ETOH.

## 2018-01-24 NOTE — ED Notes (Signed)
Patient in room asleep in no obvious signs of distress.

## 2018-01-24 NOTE — BH Assessment (Signed)
[facility] Assessment Progress Note   Clinician spoke to [employer] at [facility].  She said that patient is being moved from hallway to [place] F.  Pod F personnel will call when patient is ready.

## 2018-01-24 NOTE — ED Notes (Addendum)
Spoke with poison control. Was told by Pearletha Furl, RN to obtain an EKG STAT, to obtain a magnesium level and to keep that level above 2. And potassium to at least 2. Observation will be for atleast 6 hours.

## 2018-01-24 NOTE — ED Notes (Signed)
Pt has given permission to give girlfriend any information and her name is Arthur [name]

## 2018-01-24 NOTE — ED Triage Notes (Signed)
Pt was being transported POV to [facility] when pt became unresponsive. Garbled speech, snoring respirations, pt given Narcan 2mg  IV, minimal response. ?Seroquel OD, + etoh.  #16 L FA.

## 2018-01-24 NOTE — ED Notes (Signed)
Patient up out of bed. Patient has removed all leads and was in the process of removing his IV. Patient walked to bathroom where he refused to allow urine collection. Patient placed back into bed and is sleeping at this time. Per MD, hold medication at this time.

## 2018-01-24 NOTE — ED Notes (Signed)
Signed up for pt to move to POD F per charge, pt in hallway bed at this time, not changed to burgundy scrubs no psych hold started by previous RN.

## 2018-01-24 NOTE — ED Notes (Addendum)
Patient states he wanted to talk with MD prior to discharge. MD saw patient. Patient discharged and taken to lobby. Patient would not sign the discharge papers. Patient given prescription medication back in original bag, along with wallet which was on the chair. No other personal items were in room.

## 2018-01-24 NOTE — ED Provider Notes (Signed)
Observed several more hours. More awake. HR improved. Says he needs to go to rehab. Outpt resource list provided.    Raeford Razor, MD 01/24/18 1242

## 2018-01-25 ENCOUNTER — Encounter (HOSPITAL_COMMUNITY): Payer: Self-pay | Admitting: *Deleted

## 2018-01-25 ENCOUNTER — Inpatient Hospital Stay (HOSPITAL_COMMUNITY)
Admission: AD | Admit: 2018-01-25 | Discharge: 2018-01-28 | DRG: 897 | Disposition: A | Discharge status: Home / Self Care | Payer: Medicaid Other | Source: Intra-hospital | Attending: Psychiatry | Admitting: Psychiatry

## 2018-01-25 ENCOUNTER — Other Ambulatory Visit: Payer: Self-pay

## 2018-01-25 DIAGNOSIS — F333 Major depressive disorder, recurrent, severe with psychotic symptoms: Secondary | ICD-10-CM | POA: Diagnosis present

## 2018-01-25 DIAGNOSIS — F1994 Other psychoactive substance use, unspecified with psychoactive substance-induced mood disorder: Secondary | ICD-10-CM

## 2018-01-25 DIAGNOSIS — Z79899 Other long term (current) drug therapy: Secondary | ICD-10-CM

## 2018-01-25 DIAGNOSIS — R45851 Suicidal ideations: Secondary | ICD-10-CM | POA: Diagnosis present

## 2018-01-25 DIAGNOSIS — F1310 Sedative, hypnotic or anxiolytic abuse, uncomplicated: Secondary | ICD-10-CM | POA: Diagnosis present

## 2018-01-25 DIAGNOSIS — F1424 Cocaine dependence with cocaine-induced mood disorder: Secondary | ICD-10-CM | POA: Diagnosis present

## 2018-01-25 DIAGNOSIS — R4182 Altered mental status, unspecified: Secondary | ICD-10-CM | POA: Diagnosis not present

## 2018-01-25 DIAGNOSIS — R451 Restlessness and agitation: Secondary | ICD-10-CM | POA: Diagnosis not present

## 2018-01-25 DIAGNOSIS — Z590 Homelessness: Secondary | ICD-10-CM | POA: Diagnosis not present

## 2018-01-25 DIAGNOSIS — G4700 Insomnia, unspecified: Secondary | ICD-10-CM | POA: Diagnosis present

## 2018-01-25 DIAGNOSIS — Z915 Personal history of self-harm: Secondary | ICD-10-CM | POA: Diagnosis not present

## 2018-01-25 DIAGNOSIS — Z560 Unemployment, unspecified: Secondary | ICD-10-CM | POA: Diagnosis not present

## 2018-01-25 DIAGNOSIS — Z818 Family history of other mental and behavioral disorders: Secondary | ICD-10-CM

## 2018-01-25 DIAGNOSIS — F1120 Opioid dependence, uncomplicated: Secondary | ICD-10-CM

## 2018-01-25 DIAGNOSIS — F10239 Alcohol dependence with withdrawal, unspecified: Secondary | ICD-10-CM | POA: Diagnosis present

## 2018-01-25 DIAGNOSIS — F17210 Nicotine dependence, cigarettes, uncomplicated: Secondary | ICD-10-CM | POA: Diagnosis present

## 2018-01-25 DIAGNOSIS — F410 Panic disorder [episodic paroxysmal anxiety] without agoraphobia: Secondary | ICD-10-CM | POA: Diagnosis present

## 2018-01-25 DIAGNOSIS — F419 Anxiety disorder, unspecified: Secondary | ICD-10-CM | POA: Diagnosis not present

## 2018-01-25 DIAGNOSIS — D569 Thalassemia, unspecified: Secondary | ICD-10-CM | POA: Diagnosis present

## 2018-01-25 DIAGNOSIS — I10 Essential (primary) hypertension: Secondary | ICD-10-CM | POA: Diagnosis present

## 2018-01-25 DIAGNOSIS — F1920 Other psychoactive substance dependence, uncomplicated: Secondary | ICD-10-CM | POA: Diagnosis not present

## 2018-01-25 LAB — CBG MONITORING, ED: GLUCOSE-CAPILLARY: 98 mg/dL (ref 70–99)

## 2018-01-25 MED ORDER — ONDANSETRON 4 MG PO TBDP
4.0000 mg | ORAL_TABLET | Freq: Four times a day (QID) | ORAL | Status: DC | PRN
  Administered 2018-01-25: 4 mg via ORAL
  Filled 2018-01-25: qty 1

## 2018-01-25 MED ORDER — MONTELUKAST SODIUM 10 MG PO TABS
10.0000 mg | ORAL_TABLET | Freq: Every day | ORAL | Status: DC
  Administered 2018-01-25: 10 mg via ORAL
  Filled 2018-01-25: qty 1

## 2018-01-25 MED ORDER — TRAZODONE HCL 50 MG PO TABS
50.0000 mg | ORAL_TABLET | Freq: Every evening | ORAL | Status: DC | PRN
  Administered 2018-01-25: 50 mg via ORAL
  Filled 2018-01-25: qty 1

## 2018-01-25 MED ORDER — CHLORDIAZEPOXIDE HCL 25 MG PO CAPS: 25.0000 mg | ORAL_CAPSULE | Freq: Every day | ORAL | Status: DC

## 2018-01-25 MED ORDER — ACETAMINOPHEN 325 MG PO TABS
650.0000 mg | ORAL_TABLET | Freq: Four times a day (QID) | ORAL | Status: DC | PRN
  Administered 2018-01-26: 650 mg via ORAL
  Filled 2018-01-25: qty 2

## 2018-01-25 MED ORDER — CHLORDIAZEPOXIDE HCL 25 MG PO CAPS: 25.0000 mg | ORAL_CAPSULE | Freq: Four times a day (QID) | ORAL | Status: DC | PRN

## 2018-01-25 MED ORDER — ALUM & MAG HYDROXIDE-SIMETH 200-200-20 MG/5ML PO SUSP: 30.0000 mL | ORAL | Status: DC | PRN

## 2018-01-25 MED ORDER — CHLORDIAZEPOXIDE HCL 25 MG PO CAPS
25.0000 mg | ORAL_CAPSULE | Freq: Four times a day (QID) | ORAL | Status: AC
  Administered 2018-01-25 – 2018-01-27 (×6): 25 mg via ORAL
  Filled 2018-01-25 (×6): qty 1

## 2018-01-25 MED ORDER — LOPERAMIDE HCL 2 MG PO CAPS
2.0000 mg | ORAL_CAPSULE | ORAL | Status: DC | PRN
  Administered 2018-01-25: 4 mg via ORAL
  Filled 2018-01-25: qty 2

## 2018-01-25 MED ORDER — VITAMIN B-1 100 MG PO TABS
100.0000 mg | ORAL_TABLET | Freq: Every day | ORAL | Status: DC
  Administered 2018-01-26 – 2018-01-28 (×3): 100 mg via ORAL
  Filled 2018-01-25 (×6): qty 1

## 2018-01-25 MED ORDER — HYDROXYZINE HCL 25 MG PO TABS
25.0000 mg | ORAL_TABLET | Freq: Four times a day (QID) | ORAL | Status: DC | PRN
  Administered 2018-01-26: 25 mg via ORAL
  Filled 2018-01-25: qty 1

## 2018-01-25 MED ORDER — CHLORDIAZEPOXIDE HCL 25 MG PO CAPS
25.0000 mg | ORAL_CAPSULE | Freq: Three times a day (TID) | ORAL | Status: AC
  Administered 2018-01-27 – 2018-01-28 (×3): 25 mg via ORAL
  Filled 2018-01-25: qty 2
  Filled 2018-01-25: qty 1

## 2018-01-25 MED ORDER — CHLORDIAZEPOXIDE HCL 25 MG PO CAPS: 25.0000 mg | ORAL_CAPSULE | ORAL | Status: DC

## 2018-01-25 MED ORDER — NICOTINE 21 MG/24HR TD PT24
21.0000 mg | MEDICATED_PATCH | Freq: Every day | TRANSDERMAL | Status: DC
  Administered 2018-01-26 – 2018-01-28 (×3): 21 mg via TRANSDERMAL
  Filled 2018-01-25 (×5): qty 1

## 2018-01-25 MED ORDER — ADULT MULTIVITAMIN W/MINERALS CH
1.0000 | ORAL_TABLET | Freq: Every day | ORAL | Status: DC
  Administered 2018-01-26 – 2018-01-28 (×3): 1 via ORAL
  Filled 2018-01-25 (×6): qty 1

## 2018-01-25 MED ORDER — MAGNESIUM HYDROXIDE 400 MG/5ML PO SUSP: 30.0000 mL | Freq: Every day | ORAL | Status: DC | PRN

## 2018-01-25 NOTE — Tx Team (Signed)
Initial Treatment Plan 01/25/2018 9:28 PM Arthur [name] SAY:[id number]    PATIENT STRESSORS: Financial difficulties Marital or family conflict Medication change or noncompliance Substance abuse   PATIENT STRENGTHS: Ability for insight Average or above average intelligence Capable of independent living General fund of knowledge   PATIENT IDENTIFIED PROBLEMS: Depression Suicidal thoughts and actions Alcohol abuse "I don't understand sober people, I don't understand how they live"                     DISCHARGE CRITERIA:  Ability to meet basic life and health needs Improved stabilization in mood, thinking, and/or behavior Reduction of life-threatening or endangering symptoms to within safe limits Verbal commitment to aftercare and medication compliance Withdrawal symptoms are absent or subacute and managed without 24-hour nursing intervention  PRELIMINARY DISCHARGE PLAN: Attend aftercare/continuing care group  PATIENT/FAMILY INVOLVEMENT: This treatment plan has been presented to and reviewed with the patient, Arthur [name], and/or family member, .  The patient and family have been given the opportunity to ask questions and make suggestions.  Guerline Happ, [place], [place] 01/25/2018, 9:28 PM

## 2018-01-25 NOTE — Progress Notes (Signed)
Pt accepted to [facility]; room 303-1 Shuvon Rankin, NP is the accepting provider.  Dr. Jama Flavors is the attending provider.   Call report to [phone]   [facility] has notified [facility] ED RN Pt is voluntary and can be transported by [facility].   Pt is scheduled to arrive at [facility] between 9 and 930pm.  Wells Guiles, LCSW, LCAS Disposition CSW [facility]/TTS [phone]

## 2018-01-25 NOTE — Progress Notes (Signed)
Marq is a 47 year old male pt admitted on voluntary basis. On admission he does endorse that he took an overdose in a suicide attempt. He currently denies SI and is able to contract for safety while in the hospital. He reports that he has been abusing alcohol on a daily basis. He did have to be escorted to the bathroom and did have episodes of vomiting and diarrhea prior to being fully admitted to the unit. He reports that he is homeless and reports that he is unsure of where he will go once he is discharged. He was escorted to the unit, oriented to the unit and safety maintained.

## 2018-01-25 NOTE — ED Notes (Signed)
Valuables received from security. All valuables and belongings sent with [name] from [facility]. Pt ambulatory with no s/sx distress

## 2018-01-25 NOTE — BH Assessment (Signed)
Reassessment Note  Met for reassessment with pt who presents to [facility] reporting symptoms of depression and suicidal ideation. Pt has a history of substance abuse. He reports drinking 1/2 gallon of vodka q d x unknown amt of time. Pt states he drank 12 pk of beer yesterday bc liquor stores were closed. Pt reports current suicidal ideation "sometimes' with plans of "pills". He reports he took OD of seroquel 2 x recently with etoh. Pt acknowledges multiple symptoms of depression including:  anhedonia, isolating, worthlessness, guilt, & irritability. Pt denies homicidal ideation. Pt reports he hears voices all the time. He denies they are command hallucinations. He reports they are "yelling & screaming".  Pt states current stressors include recent break up with girlfriend that left him homeless & also a missed court date. Pt stated he "doesn't want to go back to the woods" "did that for years".  Pt states he would not feel safe leaving the hospital.  ? MSE: Pt is disheveled, sleepy, oriented x2 with normal speech and normal motor behavior. Eye contact is fair. Pt's mood is depressed and affect is depressed and anxious. Affect is congruent with mood. Thought process is coherent and relevant. There is no indication pt is currently responding to internal stimuli or experiencing delusional thought content. Pt was cooperative throughout assessment.   Diagnosis: F33.3 MDD recurrent severe w/ psychotic features; F10.20 ETOH use d/o severe  Disposition: Pt is not currently able to contract for safety outside the hospital and wants inpatient psychiatric treatment. Priscille Loveless, NP recommends inpatient tx.

## 2018-01-26 DIAGNOSIS — F419 Anxiety disorder, unspecified: Secondary | ICD-10-CM

## 2018-01-26 DIAGNOSIS — F333 Major depressive disorder, recurrent, severe with psychotic symptoms: Secondary | ICD-10-CM

## 2018-01-26 DIAGNOSIS — G4700 Insomnia, unspecified: Secondary | ICD-10-CM

## 2018-01-26 DIAGNOSIS — F1920 Other psychoactive substance dependence, uncomplicated: Secondary | ICD-10-CM

## 2018-01-26 DIAGNOSIS — Z590 Homelessness: Secondary | ICD-10-CM

## 2018-01-26 DIAGNOSIS — F1120 Opioid dependence, uncomplicated: Secondary | ICD-10-CM

## 2018-01-26 DIAGNOSIS — F17210 Nicotine dependence, cigarettes, uncomplicated: Secondary | ICD-10-CM

## 2018-01-26 DIAGNOSIS — F1994 Other psychoactive substance use, unspecified with psychoactive substance-induced mood disorder: Secondary | ICD-10-CM

## 2018-01-26 DIAGNOSIS — Z818 Family history of other mental and behavioral disorders: Secondary | ICD-10-CM

## 2018-01-26 DIAGNOSIS — R45851 Suicidal ideations: Secondary | ICD-10-CM

## 2018-01-26 DIAGNOSIS — Z560 Unemployment, unspecified: Secondary | ICD-10-CM

## 2018-01-26 LAB — URINE CULTURE
Culture: NO GROWTH
SPECIAL REQUESTS: NORMAL

## 2018-01-26 MED ORDER — BUSPIRONE HCL 10 MG PO TABS
10.0000 mg | ORAL_TABLET | Freq: Two times a day (BID) | ORAL | Status: DC
  Administered 2018-01-26 – 2018-01-28 (×4): 10 mg via ORAL
  Filled 2018-01-26 (×7): qty 1
  Filled 2018-01-26: qty 2
  Filled 2018-01-26: qty 1

## 2018-01-26 MED ORDER — HYDROXYZINE HCL 50 MG PO TABS
50.0000 mg | ORAL_TABLET | Freq: Four times a day (QID) | ORAL | Status: DC | PRN
  Administered 2018-01-26 – 2018-01-27 (×3): 50 mg via ORAL
  Filled 2018-01-26 (×3): qty 1

## 2018-01-26 MED ORDER — OLANZAPINE 10 MG PO TABS
10.0000 mg | ORAL_TABLET | Freq: Every day | ORAL | Status: DC
  Administered 2018-01-26 – 2018-01-27 (×2): 10 mg via ORAL
  Filled 2018-01-26 (×4): qty 1

## 2018-01-26 MED ORDER — MIRTAZAPINE 15 MG PO TABS
15.0000 mg | ORAL_TABLET | Freq: Every day | ORAL | Status: DC
  Administered 2018-01-26 – 2018-01-27 (×2): 15 mg via ORAL
  Filled 2018-01-26 (×5): qty 1

## 2018-01-26 MED ORDER — OLANZAPINE 5 MG PO TBDP: 5.0000 mg | ORAL_TABLET | Freq: Three times a day (TID) | ORAL | Status: DC | PRN

## 2018-01-26 NOTE — Progress Notes (Signed)
Recreation Therapy Notes  Date: 9.4.19 Time: 0930 Location: 300 Hall Dayroom  Group Topic: Stress Management  Goal Area(s) Addresses:  Patient will verbalize importance of using healthy stress management.  Patient will identify positive emotions associated with healthy stress management.   Intervention: Stress Management  Activity :  Guided Imagery.  LRT introduced the stress management technique of guided imagery.  LRT read a script that allowed patients to enjoy the summer clouds.  Patients were to follow along as LRT read script to engage in activity.  Education:  Stress Management, Discharge Planning.   Education Outcome: Acknowledges edcuation/In group clarification offered/Needs additional education  Clinical Observations/Feedback: Pt did not attend group.     Caroll Rancher, LRT/CTRS         Lillia Abed, Mindy Gali A 01/26/2018 10:57 AM

## 2018-01-26 NOTE — H&P (Addendum)
Psychiatric Admission Assessment Adult  Patient Identification: Arthur [name]  MRN:  [id number]  Date of Evaluation:  01/26/2018  Chief Complaint: Worsening suicidal thoughts, increased substance use/intoxication.  Principal Diagnosis: Polysubstance dependence including opioid type drug, continuous use ([place])  Diagnosis:   Patient Active Problem List   Diagnosis Date Noted  . Polysubstance dependence including opioid type drug, continuous use ([place]) [F11.20, F19.20] 01/26/2018    Priority: High  . Substance induced mood disorder ([place]) [F19.94] 01/26/2018    Priority: Medium  . Severe recurrent major depression w/psychotic features, mood-congruent ([place]) [F33.3] 01/25/2018  . Alcohol use disorder, severe, dependence ([place]) [F10.20] 08/09/2014  . Alcohol abuse [F10.10] 03/15/2014  . Thalassemia [D56.9]    History of Present Illness: This is an admission assessment for this 47 year old Caucasian male with hx of chronic alcoholism & other drug use including opiates. He is known on this unit from previous admission for substance abuse issues & mental health stabilization. He is currently being admitted to the [facility] adult unit from the [place] with complaints of worsening symptoms of depression, suicidal ideations & alcohol/drug intoxications. His UDS was (+) for Benzodiazepine, Cocaine & Opioid. His BAL was 201. Chart review indicated that after his initial ED visit/evaluation, he was cleared to be transferred to the [facility] for further mental health treatment, patient became unconscious on the way to [facility] & has to be taken back to the [facility] ED for evaluation. He is in need of drug/alcohol detox & mood stabilization treatments. Arthur [name] presents during this assessment; restless, trashing in his bed.  During this evaluation, Arthur [name] reports, "The ambulance took me to the ED last Sunday because I needed some help for alcohol & drug addiction. I have been using multiple drugs & drinking heavily since  age 34. It has been many, many years since I have been doing this. I drink & use drugs because I hate life. I don't understand certain people here on earth. I drink a lot of alcohol & use multiple drugs at a time to alter my mind so to not think or feel anything. I drink about half gallon of liquor everyday. I don't remember much about anything any more. I don't remember my recent stay at the hospital & what they did for me while I was there. I just need to get back on my medicines (Seroquel, Buspar). I took these medicines last a while ago. They were prescribed by [facility]. I'm still feeling like hurting myself. I have been hearing voices & seeing things as long as I can remember. I had attempted suicide x 3 in my life, one time by hanging & the other 2 times by overdose. My mother had manic depression & she completed suicide in 2008".  Associated Signs/Symptoms:  Depression Symptoms:  depressed mood, insomnia, psychomotor agitation, suicidal thoughts without plan, anxiety,  (Hypo) Manic Symptoms:  Impulsivity, Labiality of Mood,  Anxiety Symptoms:  Excessive Worry,  Psychotic Symptoms:  Hallucinations: Auditory Visual  PTSD Symptoms: "My mother killed herself in 2008" Re-experiencing:  Intrusive Thoughts  Total Time spent with patient: 1 hour  Past Psychiatric History: Polysubstance use disorder, alcoholism, Bipolar disorder.  Is the patient at risk to self? Yes.    Has the patient been a risk to self in the past 6 months? Yes.    Has the patient been a risk to self within the distant past? Yes.    Is the patient a risk to others?  No.  Has the patient been a risk to others in the past 6 months? No.  Has the patient been a risk to others within the distant past? No.   Prior Inpatient Therapy: Yes. Prior Outpatient Therapy: Yes (See [facility]).   Alcohol Screening: 1. How often do you have a drink containing alcohol?: 4 or more times  a week 2. How many drinks containing alcohol do you have on a typical day when you are drinking?: 10 or more 3. How often do you have six or more drinks on one occasion?: Daily or almost daily AUDIT-C Score: 12 4. How often during the last year have you found that you were not able to stop drinking once you had started?: Daily or almost daily 5. How often during the last year have you failed to do what was normally expected from you becasue of drinking?: Daily or almost daily 6. How often during the last year have you needed a first drink in the morning to get yourself going after a heavy drinking session?: Daily or almost daily 7. How often during the last year have you had a feeling of guilt of remorse after drinking?: Daily or almost daily 8. How often during the last year have you been unable to remember what happened the night before because you had been drinking?: Daily or almost daily 9. Have you or someone else been injured as a result of your drinking?: No 10. Has a relative or friend or a doctor or another health worker been concerned about your drinking or suggested you cut down?: Yes, during the last year Alcohol Use Disorder Identification Test Final Score (AUDIT): 36 Intervention/Follow-up: Alcohol Education  Substance Abuse History in the last 12 months:  Yes.    Consequences of Substance Abuse: Medical Consequences:  Liver damage, Possible death by overdose Legal Consequences:  Arrests, jail time, Loss of driving privilege. Family Consequences:  Family discord, divorce and or separation.  Previous Psychotropic Medications: Yes   Psychological Evaluations: No   Past Medical History:  Past Medical History:  Diagnosis Date  . ETOH abuse   . Heart murmur   . Hypertension   . Panic attack   . Thalassemia    History reviewed. No pertinent surgical history.  Family History:  Family History  Problem Relation Age of Onset  . Cancer Mother   . Mental illness Mother   .  Mental illness Father    Family Psychiatric  History: Manic depression: Mother.                                                      Completed suicide: Mother.  Tobacco Screening: Have you used any form of tobacco in the last 30 days? (Cigarettes, Smokeless Tobacco, Cigars, and/or Pipes): Yes Tobacco use, Select all that apply: 5 or more cigarettes per day Are you interested in Tobacco Cessation Medications?: Yes, will notify MD for an order Counseled patient on smoking cessation including recognizing danger situations, developing coping skills and basic information about quitting provided: Refused/Declined practical counseling  Social History: Patient is single, unemployed, disabled, collects SSI. Is homeless in the [place], area. Social History   Substance and Sexual Activity  Alcohol Use Yes  . Alcohol/week: 25.0 standard drinks  . Types: 5 Cans of beer, 20 Shots of  liquor per week     Social History   Substance and Sexual Activity  Drug Use Yes  . Types: Marijuana    Additional Social History:  Allergies:   Allergies  Allergen Reactions  . Haldol [Haloperidol Lactate] Other (See Comments)    LOCK JAW  . Risperidone And Related Other (See Comments)    Prefers not to take -- "makes you grow titties"   Lab Results:  Results for orders placed or performed during the hospital encounter of 01/24/18 (from the past 48 hour(s))  Urinalysis, Routine w reflex microscopic     Status: Abnormal   Collection Time: 01/24/18  2:56 PM  Result Value Ref Range   Color, Urine AMBER (A) YELLOW    Comment: BIOCHEMICALS MAY BE AFFECTED BY COLOR   APPearance CLEAR CLEAR   Specific Gravity, Urine 1.016 1.005 - 1.030   pH 5.0 5.0 - 8.0   Glucose, UA NEGATIVE NEGATIVE mg/dL   Hgb urine dipstick NEGATIVE NEGATIVE   Bilirubin Urine NEGATIVE NEGATIVE   Ketones, ur NEGATIVE NEGATIVE mg/dL   Protein, ur NEGATIVE NEGATIVE mg/dL   Nitrite NEGATIVE NEGATIVE   Leukocytes, UA SMALL (A) NEGATIVE    RBC / HPF 0-5 0 - 5 RBC/hpf   WBC, UA 6-10 0 - 5 WBC/hpf   Bacteria, UA FEW (A) NONE SEEN   Mucus PRESENT    Hyaline Casts, UA PRESENT     Comment: Performed at [facility], [address], [place], [place] [address]  CBG monitoring, ED     Status: None   Collection Time: 01/25/18  3:49 PM  Result Value Ref Range   Glucose-Capillary 98 70 - 99 mg/dL   Blood Alcohol level:  Lab Results  Component Value Date   ETH 201 (H) 01/24/2018   ETH <5 01/60/1093   Metabolic Disorder Labs:  No results found for: HGBA1C, MPG No results found for: PROLACTIN No results found for: CHOL, TRIG, HDL, CHOLHDL, VLDL, LDLCALC  Current Medications: Current Facility-Administered Medications  Medication Dose Route Frequency Provider Last Rate Last Dose  . acetaminophen (TYLENOL) tablet 650 mg  650 mg Oral Q6H PRN Rozetta Nunnery, NP      . alum & mag hydroxide-simeth (MAALOX/MYLANTA) 200-200-20 MG/5ML suspension 30 mL  30 mL Oral Q4H PRN Lindon Romp A, NP      . chlordiazePOXIDE (LIBRIUM) capsule 25 mg  25 mg Oral Q6H PRN Lindon Romp A, NP      . chlordiazePOXIDE (LIBRIUM) capsule 25 mg  25 mg Oral QID Lindon Romp A, NP   25 mg at 01/26/18 0813   Followed by  . [START ON 01/27/2018] chlordiazePOXIDE (LIBRIUM) capsule 25 mg  25 mg Oral TID Rozetta Nunnery, NP       Followed by  . [START ON 01/28/2018] chlordiazePOXIDE (LIBRIUM) capsule 25 mg  25 mg Oral BH-qamhs Rozetta Nunnery, NP       Followed by  . [START ON 01/30/2018] chlordiazePOXIDE (LIBRIUM) capsule 25 mg  25 mg Oral Daily Lindon Romp A, NP      . hydrOXYzine (ATARAX/VISTARIL) tablet 25 mg  25 mg Oral Q6H PRN Lindon Romp A, NP      . loperamide (IMODIUM) capsule 2-4 mg  2-4 mg Oral PRN Lindon Romp A, NP   4 mg at 01/25/18 2208  . magnesium hydroxide (MILK OF MAGNESIA) suspension 30 mL  30 mL Oral Daily PRN Lindon Romp A, NP      . multivitamin with minerals  tablet 1 tablet  1 tablet Oral Daily Lindon Romp A, NP   1 tablet at 01/26/18 0813   . nicotine (NICODERM CQ - dosed in mg/24 hours) patch 21 mg  21 mg Transdermal Daily Cobos, Myer Peer, MD   21 mg at 01/26/18 0813  . ondansetron (ZOFRAN-ODT) disintegrating tablet 4 mg  4 mg Oral Q6H PRN Lindon Romp A, NP   4 mg at 01/25/18 2208  . thiamine (VITAMIN B-1) tablet 100 mg  100 mg Oral Daily Lindon Romp A, NP   100 mg at 01/26/18 0813  . traZODone (DESYREL) tablet 50 mg  50 mg Oral QHS PRN Rozetta Nunnery, NP   50 mg at 01/25/18 2209   PTA Medications: Medications Prior to Admission  Medication Sig Dispense Refill Last Dose  . busPIRone (BUSPAR) 30 MG tablet Take 30 mg by mouth 2 (two) times daily.   unk  . escitalopram (LEXAPRO) 10 MG tablet Take 1 tablet (10 mg total) by mouth daily. (Patient not taking: Reported on 01/24/2018) 30 tablet 0 Not Taking at Unknown time  . folic acid (FOLVITE) 1 MG tablet Take 1 mg by mouth daily.   unk  . hydrOXYzine (ATARAX/VISTARIL) 25 MG tablet Take 1 tablet (25 mg total) by mouth at bedtime as needed for anxiety. (Patient not taking: Reported on 01/24/2018) 30 tablet 0 Not Taking at Unknown time  . lisdexamfetamine (VYVANSE) 40 MG capsule Take 40 mg by mouth every morning.   unk  . montelukast (SINGULAIR) 10 MG tablet Take 10 mg by mouth daily.   unk  . naproxen (NAPROSYN) 500 MG tablet Take 1 tablet (500 mg total) by mouth 2 (two) times daily. (Patient not taking: Reported on 01/24/2018) 14 tablet 0 Not Taking at Unknown time  . QUEtiapine (SEROQUEL) 400 MG tablet Take 800 mg by mouth at bedtime.    unk  . traZODone (DESYREL) 50 MG tablet Take 1 tablet (50 mg total) by mouth at bedtime as needed for sleep. For insomnia (Patient not taking: Reported on 08/08/2014) 30 tablet 0 Completed Course at Unknown time   Musculoskeletal: Strength & Muscle Tone: within normal limits Gait & Station: normal Patient leans: N/A  Psychiatric Specialty Exam: Physical Exam  Constitutional: He is oriented to person, place, and time. He appears well-developed.   Disheveled  HENT:  Head: Normocephalic.  Eyes: Pupils are equal, round, and reactive to light.  Cardiovascular: Normal rate.  Respiratory: Effort normal.  GI: Soft.  Genitourinary:  Genitourinary Comments: Deferred  Musculoskeletal: Normal range of motion.  Neurological: He is alert and oriented to person, place, and time.  Skin: Skin is warm.    ROS  Blood pressure 105/78, pulse 100, temperature 98.3 F (36.8 C), temperature source Oral, resp. rate 18, height 5' 10" (1.778 m), weight 73 kg.Body mass index is 23.1 kg/m.  General Appearance: Disheveled  Eye Contact:  Poor  Speech:  Clear and Coherent and Normal Rate  Volume:  Decreased  Mood:  Anxious and Depressed  Affect:  Restricted  Thought Process:  Coherent and Descriptions of Associations: Intact  Orientation:  Full (Time, Place, and Person)  Thought Content:  Hallucinations: Auditory Visual  Suicidal Thoughts:  Yes.  without intent/plan  Homicidal Thoughts:  Denies   Memory:  Immediate;   Fair Recent;   Fair Remote;   Fair  Judgement:  Fair  Insight:  Fair  Psychomotor Activity:  Restlessness and Tremor  Concentration:  Concentration: Poor and Attention Span: Poor  Recall:  [place] of Knowledge:  Fair  Language:  Good  Akathisia:  Negative  Handed:  Right  AIMS (if indicated):     Assets:  Communication Skills Desire for Improvement  ADL's:  Intact  Cognition:  WNL  Sleep:  Number of Hours: 3.5   Treatment Plan/Recommendations: 1. Admit for crisis management and stabilization, estimated length of stay 3-5 days.   2. Medication management to reduce current symptoms to base line and improve the patient's overall level of functioning: See MAR, Md's SRA & treatment plan.   Observation Level/Precautions:  15 minute checks  Laboratory:  Per ED, UDS (+) for Benzodiazepine, Cocaine & Opioid,  Psychotherapy: Group sessions  Medications: See MAR   Consultations: As needed.  Discharge Concerns: Safety,  sobriety  Estimated LOS: 2-4 days  Other: Admit to the [facility].   Physician Treatment Plan for Primary Diagnosis: Polysubstance dependence including opioid type drug, continuous use ([place])  Long Term Goal(s): Improvement in symptoms so as ready for discharge  Short Term Goals: Ability to identify changes in lifestyle to reduce recurrence of condition will improve, Ability to disclose and discuss suicidal ideas and Ability to demonstrate self-control will improve  Physician Treatment Plan for Secondary Diagnosis: Principal Problem:   Polysubstance dependence including opioid type drug, continuous use ([place]) Active Problems:   Substance induced mood disorder ([facility])   Severe recurrent major depression w/psychotic features, mood-congruent ([place])  Long Term Goal(s): Improvement in symptoms so as ready for discharge  Short Term Goals: Ability to identify and develop effective coping behaviors will improve, Compliance with prescribed medications will improve and Ability to identify triggers associated with substance abuse/mental health issues will improve  I certify that inpatient services furnished can reasonably be expected to improve the patient's condition.    Lindell Spar, NP, PMHNP, FNP-BC 9/4/20199:47 AM   I have reviewed NP's Note, assessement, diagnosis and plan, and agree. I have also met with patient and completed suicide risk assessment.  Forrester Blando is a 47 y/o M with history of MDD and polysubstance abuse who was [facility] voluntarily from [facility] where he had initially presented after being brought in with intoxication and altered mental status, and then after discharging he returned with worsening depression and SI without plan in the context of worsening use of illicit substances. UDS was positive for cocaine, opiates, benzodiazepines, and pt reported heavy use of alcohol. He was started on alcohol withdrawal protocol with librium. He was medically cleared and then transferred to [facility]  for additional treatment and evaluation.  Upon initial interview, pt is irritable, short, minimizing, and he perseverates on receiving seroquel, specifying that he needs a dose of "800 milligrams." When asked about his reasons for coming in, pt shares, "I've been real, real depressed. I figured I wanted to get some help before I do something stupid." He cites stressor of breakup with his girlfriend. He endorses SI without plan. He denies HI. He endorses AH of vague voices. He denies VH. He reports poor sleep, anhedonia, guilty feelings, low energy, and poor concentration. He denies symptoms of mania, OCD, and PTSD. He confirms he has been using cocaine and "whatever else I can get." He has been drinking about 1/2 gallon of hard alcohol per day.  Discussed with patient about treatment options. He reports that he has been misusing his seroquel to get himself to sleep, despite being prescribed it from [employer]. We discussed that his mood symptoms appear to not be managed with it, so we will stop  seroquel and instead start trial of remeron and zyprexa. Pt was in agreement with this plan, and he will speak with SW team about substance use treatment options.   PLAN OF CARE:   -Admit to inpatient level of care   -MDD, recurrent, severe, with psychotic features             -DC seroquel              -Start remeron 66m po qhs             -Start zyprexa 175mpo qhs  -Anxiety                        -Restart buspar 1071mo BID             -Continue vistaril 25m27m q6h prn anxiety  -insomnia             -Continue trazodone 25mg37mqhs prn insomnia  -alcohol use disorder, withdrawal             -Continue CIWA with librium  -Agitation                      -Continue zydis 5mg p61m8h prn anxiety  -Encourage participation in groups and therapeutic milieu  -disposition planning will be ongoing  ChristMaris Berger

## 2018-01-26 NOTE — Tx Team (Signed)
Interdisciplinary Treatment and Diagnostic Plan Update  01/26/2018 Time of Session: [id number] Moo Gravley MRN: [id number]  Principal Diagnosis: Polysubstance dependence including opioid type drug, continuous use ([place])  Secondary Diagnoses: Principal Problem:   Polysubstance dependence including opioid type drug, continuous use ([place]) Active Problems:   Severe recurrent major depression w/psychotic features, mood-congruent (HCC)   Substance induced mood disorder ([place])   Current Medications:  Current Facility-Administered Medications  Medication Dose Route Frequency Provider Last Rate Last Dose  . acetaminophen (TYLENOL) tablet 650 mg  650 mg Oral Q6H PRN Rozetta Nunnery, NP      . alum & mag hydroxide-simeth (MAALOX/MYLANTA) 200-200-20 MG/5ML suspension 30 mL  30 mL Oral Q4H PRN Lindon Romp A, NP      . chlordiazePOXIDE (LIBRIUM) capsule 25 mg  25 mg Oral Q6H PRN Lindon Romp A, NP      . chlordiazePOXIDE (LIBRIUM) capsule 25 mg  25 mg Oral QID Lindon Romp A, NP   25 mg at 01/26/18 0813   Followed by  . [START ON 01/27/2018] chlordiazePOXIDE (LIBRIUM) capsule 25 mg  25 mg Oral TID Rozetta Nunnery, NP       Followed by  . [START ON 01/28/2018] chlordiazePOXIDE (LIBRIUM) capsule 25 mg  25 mg Oral BH-qamhs Rozetta Nunnery, NP       Followed by  . [START ON 01/30/2018] chlordiazePOXIDE (LIBRIUM) capsule 25 mg  25 mg Oral Daily Lindon Romp A, NP      . hydrOXYzine (ATARAX/VISTARIL) tablet 25 mg  25 mg Oral Q6H PRN Lindon Romp A, NP      . loperamide (IMODIUM) capsule 2-4 mg  2-4 mg Oral PRN Lindon Romp A, NP   4 mg at 01/25/18 2208  . magnesium hydroxide (MILK OF MAGNESIA) suspension 30 mL  30 mL Oral Daily PRN Lindon Romp A, NP      . multivitamin with minerals tablet 1 tablet  1 tablet Oral Daily Lindon Romp A, NP   1 tablet at 01/26/18 0813  . nicotine (NICODERM CQ - dosed in mg/24 hours) patch 21 mg  21 mg Transdermal Daily Cobos, Myer Peer, MD   21 mg at 01/26/18 0813  . ondansetron  (ZOFRAN-ODT) disintegrating tablet 4 mg  4 mg Oral Q6H PRN Lindon Romp A, NP   4 mg at 01/25/18 2208  . thiamine (VITAMIN B-1) tablet 100 mg  100 mg Oral Daily Lindon Romp A, NP   100 mg at 01/26/18 0813  . traZODone (DESYREL) tablet 50 mg  50 mg Oral QHS PRN Rozetta Nunnery, NP   50 mg at 01/25/18 2209   PTA Medications: Medications Prior to Admission  Medication Sig Dispense Refill Last Dose  . busPIRone (BUSPAR) 30 MG tablet Take 30 mg by mouth 2 (two) times daily.   unk  . escitalopram (LEXAPRO) 10 MG tablet Take 1 tablet (10 mg total) by mouth daily. (Patient not taking: Reported on 01/24/2018) 30 tablet 0 Not Taking at Unknown time  . folic acid (FOLVITE) 1 MG tablet Take 1 mg by mouth daily.   unk  . hydrOXYzine (ATARAX/VISTARIL) 25 MG tablet Take 1 tablet (25 mg total) by mouth at bedtime as needed for anxiety. (Patient not taking: Reported on 01/24/2018) 30 tablet 0 Not Taking at Unknown time  . lisdexamfetamine (VYVANSE) 40 MG capsule Take 40 mg by mouth every morning.   unk  . montelukast (SINGULAIR) 10 MG tablet Take 10 mg by mouth daily.   unk  .  naproxen (NAPROSYN) 500 MG tablet Take 1 tablet (500 mg total) by mouth 2 (two) times daily. (Patient not taking: Reported on 01/24/2018) 14 tablet 0 Not Taking at Unknown time  . QUEtiapine (SEROQUEL) 400 MG tablet Take 800 mg by mouth at bedtime.    unk  . traZODone (DESYREL) 50 MG tablet Take 1 tablet (50 mg total) by mouth at bedtime as needed for sleep. For insomnia (Patient not taking: Reported on 08/08/2014) 30 tablet 0 Completed Course at Unknown time    Patient Stressors: Financial difficulties Marital or family conflict Medication change or noncompliance Substance abuse  Patient Strengths: Ability for insight Average or above average intelligence Capable of independent living General fund of knowledge  Treatment Modalities: Medication Management, Group therapy, Case management,  1 to 1 session with clinician, Psychoeducation,  Recreational therapy.   Physician Treatment Plan for Primary Diagnosis: Polysubstance dependence including opioid type drug, continuous use ([place]) Long Term Goal(s): Improvement in symptoms so as ready for discharge Improvement in symptoms so as ready for discharge   Short Term Goals: Ability to identify changes in lifestyle to reduce recurrence of condition will improve Ability to disclose and discuss suicidal ideas Ability to demonstrate self-control will improve Ability to identify and develop effective coping behaviors will improve Compliance with prescribed medications will improve Ability to identify triggers associated with substance abuse/mental health issues will improve  Medication Management: Evaluate patient's response, side effects, and tolerance of medication regimen.  Therapeutic Interventions: 1 to 1 sessions, Unit Group sessions and Medication administration.  Evaluation of Outcomes: Not Met  Physician Treatment Plan for Secondary Diagnosis: Principal Problem:   Polysubstance dependence including opioid type drug, continuous use ([place]) Active Problems:   Severe recurrent major depression w/psychotic features, mood-congruent ([place])   Substance induced mood disorder ([place])  Long Term Goal(s): Improvement in symptoms so as ready for discharge Improvement in symptoms so as ready for discharge   Short Term Goals: Ability to identify changes in lifestyle to reduce recurrence of condition will improve Ability to disclose and discuss suicidal ideas Ability to demonstrate self-control will improve Ability to identify and develop effective coping behaviors will improve Compliance with prescribed medications will improve Ability to identify triggers associated with substance abuse/mental health issues will improve     Medication Management: Evaluate patient's response, side effects, and tolerance of medication regimen.  Therapeutic Interventions: 1 to 1 sessions, Unit Group  sessions and Medication administration.  Evaluation of Outcomes: Not Met   RN Treatment Plan for Primary Diagnosis: Polysubstance dependence including opioid type drug, continuous use ([place]) Long Term Goal(s): Knowledge of disease and therapeutic regimen to maintain health will improve  Short Term Goals: Ability to remain free from injury will improve, Ability to verbalize feelings will improve and Ability to identify and develop effective coping behaviors will improve  Medication Management: RN will administer medications as ordered by provider, will assess and evaluate patient's response and provide education to patient for prescribed medication. RN will report any adverse and/or side effects to prescribing provider.  Therapeutic Interventions: 1 on 1 counseling sessions, Psychoeducation, Medication administration, Evaluate responses to treatment, Monitor vital signs and CBGs as ordered, Perform/monitor CIWA, COWS, AIMS and Fall Risk screenings as ordered, Perform wound care treatments as ordered.  Evaluation of Outcomes: Not met   LCSW Treatment Plan for Primary Diagnosis: Polysubstance dependence including opioid type drug, continuous use ([place]) Long Term Goal(s): Safe transition to appropriate next level of care at discharge, Engage patient in therapeutic group addressing interpersonal  concerns.  Short Term Goals: Engage patient in aftercare planning with referrals and resources, Facilitate acceptance of mental health diagnosis and concerns and Facilitate patient progression through stages of change regarding substance use diagnoses and concerns  Therapeutic Interventions: Assess for all discharge needs, 1 to 1 time with Social worker, Explore available resources and support systems, Assess for adequacy in community support network, Educate family and significant other(s) on suicide prevention, Complete Psychosocial Assessment, Interpersonal group therapy.  Evaluation of Outcomes: Not  Met   Progress in Treatment: Attending groups: No. New to unit.  Participating in groups: No. Taking medication as prescribed: Yes. Toleration medication: Yes. Family/Significant other contact made: No, will contact:  pt's girlfriend with pt's consent Patient understands diagnosis: Yes. Discussing patient identified problems/goals with staff: Yes. Medical problems stabilized or resolved: Yes. Denies suicidal/homicidal ideation: Yes. Issues/concerns per patient self-inventory: No. Other: n/a   New problem(s) identified: No, Describe:  n/a  New Short Term/Long Term Goal(s): detox, medication management for mood stabilization; elimination of SI thoughts; development of comprehensive mental wellness/sobriety plan.   Patient Goals:  "Alcohol abuse and suicidal thoughts. I guess I need to get into treatment."   Discharge Plan or Barriers: CSW assessing for appropriate referrals. Pt reports that he missed two court dates on 8/31 and 9/4. Pt states he may be interested in residential treatment. [facility] screening; [facility] not an option until he goes to court. Pt may have active warrant due to missed court dates. Pt was encouraged to call the clerk of court in [place] to inform them of his hospitalization but may still have warrant due to missing court prior to being hospitalized. Pt minimally interested in doing this.   Reason for Continuation of Hospitalization: Anxiety Depression Medication stabilization Suicidal ideation Withdrawal symptoms  Estimated Length of Stay: Monday, 01/31/18  Attendees: Patient: 01/26/2018 10:57 AM  Physician: Dr. Parke Poisson MD; Dr. Nancy Fetter MD 01/26/2018 10:57 AM  Nursing: Yetta Flock RN; [facility] RN 01/26/2018 10:57 AM  RN Care Manager:x 01/26/2018 10:57 AM  Social Worker: Janice Norrie LCSW 01/26/2018 10:57 AM  Recreational Therapist: Rhunette Croft 01/26/2018 10:57 AM  Other: Lindell Spar NP; Mordecai Maes NP 01/26/2018 10:57 AM  Other:  01/26/2018 10:57 AM  Other: 01/26/2018 10:57 AM     Scribe for Treatment Team: Avelina Laine, LCSW 01/26/2018 10:57 AM
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
## 2018-01-26 NOTE — BHH Group Notes (Addendum)
[facility] Group Notes:  (Nursing/MHT/Case Management/Adjunct)  Date:  01/26/2018  Time:  4:00 pm  Type of Therapy:  Psychoeducational Skills  Participation Level:  Did not attend  Participation Quality:    Affect:    Cognitive:    Insight:    Engagement in Group:    Modes of Intervention:    Summary of Progress/Problems:      Earline Mayotte 01/26/2018, 6:00 PM

## 2018-01-26 NOTE — Plan of Care (Signed)
  Problem: Education: Goal: Emotional status will improve Outcome: Progressing   Problem: Education: Goal: Mental status will improve Outcome: Progressing   Problem: Education: Goal: Verbalization of understanding the information provided will improve Outcome: Progressing   Problem: Activity: Goal: Interest or engagement in activities will improve Outcome: Progressing   

## 2018-01-26 NOTE — BHH Suicide Risk Assessment (Addendum)
[facility] INPATIENT:  Family/Significant Other Suicide Prevention Education  Suicide Prevention Education:  Contact Attempts: Joanell Rising (pt's exgirlfriend) [phone] has been identified by the patient as the family member/significant other with whom the patient will be residing, and identified as the person(s) who will aid the patient in the event of a mental health crisis.  With written consent from the patient, two attempts were made to provide suicide prevention education, prior to and/or following the patient's discharge.  We were unsuccessful in providing suicide prevention education.  A suicide education pamphlet was given to the patient to share with family/significant other.  Date and time of first attempt: 01/26/18 at 2:45PM (voicemail left requesting call back at her earliest convenience).   Rona Ravens LCSW 01/26/2018, 2:46 PM   SPE completed with pt's ex-girlfriend. She states that pt relapsed about 2 months ago and has been drinking heavily. She reports that pt has been "being very impulsive and making bad choices lately, which is why we are not together right now." She hopes to get the number of his attorney for pt's court date tomorrow. CSW passed message along to the unit for pt to contact her if he wants her to have code and get this information from him.  Siddalee Vanderheiden S. Alan Ripper, MSW, LCSW Clinical Social Worker 01/26/2018 3:02 PM

## 2018-01-26 NOTE — [facility] Suicide Risk Assessment (Signed)
[facility] Admission Suicide Risk Assessment   Nursing information obtained from:  Patient Demographic factors:  Male, Caucasian, Low socioeconomic status, Living alone, Unemployed Current Mental Status:  Self-harm behaviors Loss Factors:  Loss of significant relationship Historical Factors:  Prior suicide attempts, Family history of mental illness or substance abuse Risk Reduction Factors:  Positive coping skills or problem solving skills  Total Time spent with patient: 1 hour Principal Problem: Polysubstance dependence including opioid type drug, continuous use (HCC) Diagnosis:   Patient Active Problem List   Diagnosis Date Noted  . Polysubstance dependence including opioid type drug, continuous use (HCC) [F11.20, F19.20] 01/26/2018  . Substance induced mood disorder (HCC) [F19.94] 01/26/2018  . Severe recurrent major depression w/psychotic features, mood-congruent (HCC) [F33.3] 01/25/2018  . Alcohol use disorder, severe, dependence (HCC) [F10.20] 08/09/2014  . Alcohol abuse [F10.10] 03/15/2014  . Thalassemia [D56.9]    Subjective Data:   Arthur [name] is a 47 y/o M with history of MDD and polysubstance abuse who was admitted voluntarily from [facility] where he had initially presented after being brought in with intoxication and altered mental status, and then after discharging he returned with worsening depression and SI without plan in the context of worsening use of illicit substances. UDS was positive for cocaine, opiates, benzodiazepines, and pt reported heavy use of alcohol. He was started on alcohol withdrawal protocol with librium. He was medically cleared and then transferred to [facility] for additional treatment and evaluation.  Upon initial interview, pt is irritable, short, minimizing, and he perseverates on receiving seroquel, specifying that he needs a dose of "800 milligrams." When asked about his reasons for coming in, pt shares, "I've been real, real depressed. I figured I wanted to get  some help before I do something stupid." He cites stressor of breakup with his girlfriend. He endorses SI without plan. He denies HI. He endorses AH of vague voices. He denies VH. He reports poor sleep, anhedonia, guilty feelings, low energy, and poor concentration. He denies symptoms of mania, OCD, and PTSD. He confirms he has been using cocaine and "whatever else I can get." He has been drinking about 1/2 gallon of hard alcohol per day.  Discussed with patient about treatment options. He reports that he has been misusing his seroquel to get himself to sleep, despite being prescribed it from [employer]. We discussed that his mood symptoms appear to not be managed with it, so we will stop seroquel and instead start trial of remeron and zyprexa. Pt was in agreement with this plan, and he will speak with SW team about substance use treatment options.    Continued Clinical Symptoms:  Alcohol Use Disorder Identification Test Final Score (AUDIT): 36 The "Alcohol Use Disorders Identification Test", Guidelines for Use in Primary Care, Second Edition.  World Science writer [facility]). Score between 0-7:  no or low risk or alcohol related problems. Score between 8-15:  moderate risk of alcohol related problems. Score between 16-19:  high risk of alcohol related problems. Score 20 or above:  warrants further diagnostic evaluation for alcohol dependence and treatment.   CLINICAL FACTORS:   Severe Anxiety and/or Agitation Bipolar Disorder:   Depressive phase Depression:   Comorbid alcohol abuse/dependence Alcohol/Substance Abuse/Dependencies   Musculoskeletal: Strength & Muscle Tone: within normal limits Gait & Station: normal Patient leans: N/A  Psychiatric Specialty Exam: Physical Exam  Nursing note and vitals reviewed.   Review of Systems  Constitutional: Negative for chills and fever.  Respiratory: Negative for cough and shortness of breath.  Cardiovascular: Negative for chest pain.   Gastrointestinal: Negative for abdominal pain, heartburn, nausea and vomiting.  Psychiatric/Behavioral: Positive for depression, hallucinations, substance abuse and suicidal ideas. The patient is nervous/anxious and has insomnia.     Blood pressure 105/78, pulse 100, temperature 98.3 F (36.8 C), temperature source Oral, resp. rate 18, height 5\' 10"  (1.778 m), weight 73 kg.Body mass index is 23.1 kg/m.  General Appearance: Casual and Disheveled  Eye Contact:  Poor  Speech:  Clear and Coherent and Normal Rate  Volume:  Normal  Mood:  Anxious and Depressed  Affect:  Blunt, Constricted and Depressed  Thought Process:  Coherent and Goal Directed  Orientation:  Full (Time, Place, and Person)  Thought Content:  Hallucinations: Auditory  Suicidal Thoughts:  Yes.  without intent/plan  Homicidal Thoughts:  No  Memory:  Immediate;   Fair Recent;   Fair Remote;   Fair  Judgement:  Poor  Insight:  Lacking  Psychomotor Activity:  Normal  Concentration:  Concentration: Fair  Recall:  [employer] of Knowledge:  Fair  Language:  Fair  Akathisia:  No  Handed:    AIMS (if indicated):     Assets:  Resilience Social Support  ADL's:  Intact  Cognition:  WNL  Sleep:  Number of Hours: 3.5      COGNITIVE FEATURES THAT CONTRIBUTE TO RISK:  None    SUICIDE RISK:   Moderate:  Frequent suicidal ideation with limited intensity, and duration, some specificity in terms of plans, no associated intent, good self-control, limited dysphoria/symptomatology, some risk factors present, and identifiable protective factors, including available and accessible social support.  PLAN OF CARE:   -Admit to inpatient level of care   -MDD, recurrent, severe, with psychotic features   -DC seroquel   -Start remeron 15mg  po qhs   -Start zyprexa 10mg  po qhs  -Anxiety    -Restart buspar 10mg  po BID   -Continue vistaril 50mg  po q6h prn anxiety  -insomnia  -Continue trazodone 50mg  po qhs prn  insomnia  -alcohol use disorder, withdrawal  -Continue CIWA with librium  -Agitation    -Continue zydis 5mg  po q8h prn anxiety  -Encourage participation in groups and therapeutic milieu  -disposition planning will be ongoing  I certify that inpatient services furnished can reasonably be expected to improve the patient's condition.   Micheal Likens, MD 01/26/2018, 3:36 PM

## 2018-01-26 NOTE — [facility] Group Notes (Signed)
LCSW Group Therapy Note 01/26/2018 11:33 AM  Type of Therapy and Topic: Group Therapy: Overcoming Obstacles  Participation Level: Did Not Attend  Description of Group:  In this group patients will be encouraged to explore what they see as obstacles to their own wellness and recovery. They will be guided to discuss their thoughts, feelings, and behaviors related to these obstacles. The group will process together ways to cope with barriers, with attention given to specific choices patients can make. Each patient will be challenged to identify changes they are motivated to make in order to overcome their obstacles. This group will be process-oriented, with patients participating in exploration of their own experiences as well as giving and receiving support and challenge from other group members.  Therapeutic Goals: 1. Patient will identify personal and current obstacles as they relate to admission. 2. Patient will identify barriers that currently interfere with their wellness or overcoming obstacles.  3. Patient will identify feelings, thought process and behaviors related to these barriers. 4. Patient will identify two changes they are willing to make to overcome these obstacles:   Summary of Patient Progress  Invited, chose not to attend.    Therapeutic Modalities:  Cognitive Behavioral Therapy Solution Focused Therapy Motivational Interviewing Relapse Prevention Therapy   Alcario Drought Clinical Social Worker

## 2018-01-27 DIAGNOSIS — F1994 Other psychoactive substance use, unspecified with psychoactive substance-induced mood disorder: Secondary | ICD-10-CM

## 2018-01-27 DIAGNOSIS — R451 Restlessness and agitation: Secondary | ICD-10-CM

## 2018-01-27 MED ORDER — GABAPENTIN 100 MG PO CAPS
200.0000 mg | ORAL_CAPSULE | Freq: Three times a day (TID) | ORAL | Status: DC
  Administered 2018-01-27 – 2018-01-28 (×2): 200 mg via ORAL
  Filled 2018-01-27 (×6): qty 2

## 2018-01-27 NOTE — Progress Notes (Signed)
Nursing Note: 0700-1900  D:  Pt presents with depressed mood and irritable affect. Pt remains in bed all day and states that he feels terrible, "I fucking hate that the doctor stopped my Seraquil, I feel like shit!  When can I get out of here?" "I feel like I will have a panic attack if I don't get my Seraquil back."  Pt did state earlier that he did overdose on Seraquil prior to admission.  A:  Pt receiving Librium as ordered, Vistaril given prn for anxiety. Encouraged to verbalize needs and concerns, active listening and support provided.  Continued Q 15 minute safety checks.  R:  Pt. states that he hears voices much of the time and just wants to sleep. He is currently able to verbally contract for safety.

## 2018-01-27 NOTE — Progress Notes (Signed)
[facility] MD Progress Note  01/27/2018 11:21 AM Arthur [name]  MRN:  [id number]  Subjective: Arthur [name] reports, "I'm feeling a little better today. My mood is okay. Mt girlfriend visited last evening, it was a good visit. I'm still having a lot of anxiety issues. I slept last night, better than the previous days. I hear voices at night, bunch of whispering going on. I have not attended any group sessions yet, but will try to go today"  Arthur [name] is a 47 y/o M with history of MDD and polysubstance abuse who was admitted voluntarily from [facility] where he had initially presented after being brought in with intoxication and altered mental status, and then after discharging he returned with worsening depression and SI without plan in the context of worsening use of illicit substances. UDS was positive for cocaine, opiates, benzodiazepines, and pt reported heavy use of alcohol. He was started on alcohol withdrawal protocol with librium. He was medically cleared and then transferred to [facility] for additional treatment and evaluation. Upon initial interview, pt is irritable, short, minimizing, and he perseverates on receiving seroquel, specifying that he needs a dose of "800 milligrams." When asked about his reasons for coming in, pt shares, "I've been real, real depressed. I figured I wanted to get some help before I do something stupid." He cites stressor of breakup with his girlfriend. He endorses SI without plan. He denies HI. He endorses AH of vague voices. He denies VH. He reports poor sleep, anhedonia, guilty feelings, low energy, and poor concentration. He denies symptoms of mania, OCD, and PTSD. He confirms he has been using cocaine and "whatever else I can get".  Arthur [name] is seen, chart reviewed. The chart findings discussed with the treatment team. He is sitting on his bed, awake & alert. He is verbally responsive making fair eye contact. He says he is doing a little better today. He continues to complain of high anxiety  levels. Is also complaining of auditory hallucinations he described as whispering voices, mostly at night time. He denies any HI & VH. He continues to endorse passive SI, able to contract for safety. He is taking & tolerating his treatment regimen. He denies any side effects. Arthur [name] has not attended any group sessions as of yet. Has agreed to get out of bed & attend group sessions & other recreational activities & meetings being held on this unit. He says his girlfriend visited him last evening. Says it was good visit. Says he is now motivated to get better. He has agreed to start Gabapentin 200 mg po tid to help combat the substance withdrawal symptoms. He does not appear to be responding to any internal stimuli.  Principal Problem: Polysubstance dependence including opioid type drug, continuous use (HCC)  Diagnosis:   Patient Active Problem List   Diagnosis Date Noted  . Polysubstance dependence including opioid type drug, continuous use (HCC) [F11.20, F19.20] 01/26/2018    Priority: High  . Substance induced mood disorder (HCC) [F19.94] 01/26/2018    Priority: Medium  . Severe recurrent major depression w/psychotic features, mood-congruent (HCC) [F33.3] 01/25/2018  . Alcohol use disorder, severe, dependence (HCC) [F10.20] 08/09/2014  . Alcohol abuse [F10.10] 03/15/2014  . Thalassemia [D56.9]    Total Time spent with patient: 25 minutes  Past Psychiatric History: See H&P  Past Medical History:  Past Medical History:  Diagnosis Date  . ETOH abuse   . Heart murmur   . Hypertension   . Panic attack   . Thalassemia  History reviewed. No pertinent surgical history.  Family History:  Family History  Problem Relation Age of Onset  . Cancer Mother   . Mental illness Mother   . Mental illness Father    Family Psychiatric  History: See H&P  Social History:  Social History   Substance and Sexual Activity  Alcohol Use Yes  . Alcohol/week: 25.0 standard drinks  . Types: 5 Cans of  beer, 20 Shots of liquor per week     Social History   Substance and Sexual Activity  Drug Use Yes  . Types: Marijuana    Social History   Socioeconomic History  . Marital status: Single    Spouse name: Not on file  . Number of children: Not on file  . Years of education: Not on file  . Highest education level: Not on file  Occupational History  . Not on file  Social Needs  . Financial resource strain: Not on file  . Food insecurity:    Worry: Not on file    Inability: Not on file  . Transportation needs:    Medical: Not on file    Non-medical: Not on file  Tobacco Use  . Smoking status: Current Every Day Smoker    Packs/day: 1.00  . Smokeless tobacco: Never Used  Substance and Sexual Activity  . Alcohol use: Yes    Alcohol/week: 25.0 standard drinks    Types: 5 Cans of beer, 20 Shots of liquor per week  . Drug use: Yes    Types: Marijuana  . Sexual activity: Yes  Lifestyle  . Physical activity:    Days per week: Not on file    Minutes per session: Not on file  . Stress: Not on file  Relationships  . Social connections:    Talks on phone: Not on file    Gets together: Not on file    Attends religious service: Not on file    Active member of club or organization: Not on file    Attends meetings of clubs or organizations: Not on file    Relationship status: Not on file  Other Topics Concern  . Not on file  Social History Narrative  . Not on file   Additional Social History:   Sleep: Good  Appetite:  Fair  Current Medications: Current Facility-Administered Medications  Medication Dose Route Frequency Provider Last Rate Last Dose  . acetaminophen (TYLENOL) tablet 650 mg  650 mg Oral Q6H PRN Nira Conn A, NP   650 mg at 01/26/18 2207  . alum & mag hydroxide-simeth (MAALOX/MYLANTA) 200-200-20 MG/5ML suspension 30 mL  30 mL Oral Q4H PRN Nira Conn A, NP      . busPIRone (BUSPAR) tablet 10 mg  10 mg Oral BID Micheal Likens, MD   10 mg at  01/27/18 0939  . chlordiazePOXIDE (LIBRIUM) capsule 25 mg  25 mg Oral Q6H PRN Nira Conn A, NP      . chlordiazePOXIDE (LIBRIUM) capsule 25 mg  25 mg Oral TID Jackelyn Poling, NP       Followed by  . [START ON 01/28/2018] chlordiazePOXIDE (LIBRIUM) capsule 25 mg  25 mg Oral BH-qamhs Jackelyn Poling, NP       Followed by  . [START ON 01/30/2018] chlordiazePOXIDE (LIBRIUM) capsule 25 mg  25 mg Oral Daily Nira Conn A, NP      . hydrOXYzine (ATARAX/VISTARIL) tablet 50 mg  50 mg Oral Q6H PRN Micheal Likens, MD   50 mg at  01/26/18 1815  . loperamide (IMODIUM) capsule 2-4 mg  2-4 mg Oral PRN Nira Conn A, NP   4 mg at 01/25/18 2208  . magnesium hydroxide (MILK OF MAGNESIA) suspension 30 mL  30 mL Oral Daily PRN Nira Conn A, NP      . mirtazapine (REMERON) tablet 15 mg  15 mg Oral QHS Micheal Likens, MD   15 mg at 01/26/18 2202  . multivitamin with minerals tablet 1 tablet  1 tablet Oral Daily Nira Conn A, NP   1 tablet at 01/27/18 0939  . nicotine (NICODERM CQ - dosed in mg/24 hours) patch 21 mg  21 mg Transdermal Daily Cobos, Rockey Situ, MD   21 mg at 01/27/18 0941  . OLANZapine (ZYPREXA) tablet 10 mg  10 mg Oral QHS Micheal Likens, MD   10 mg at 01/26/18 2202  . OLANZapine zydis (ZYPREXA) disintegrating tablet 5 mg  5 mg Oral Q8H PRN Micheal Likens, MD      . ondansetron (ZOFRAN-ODT) disintegrating tablet 4 mg  4 mg Oral Q6H PRN Nira Conn A, NP   4 mg at 01/25/18 2208  . thiamine (VITAMIN B-1) tablet 100 mg  100 mg Oral Daily Nira Conn A, NP   100 mg at 01/27/18 0939  . traZODone (DESYREL) tablet 50 mg  50 mg Oral QHS PRN Jackelyn Poling, NP   50 mg at 01/25/18 2209   Lab Results:  Results for orders placed or performed during the hospital encounter of 01/24/18 (from the past 48 hour(s))  CBG monitoring, ED     Status: None   Collection Time: 01/25/18  3:49 PM  Result Value Ref Range   Glucose-Capillary 98 70 - 99 mg/dL   Blood Alcohol level:   Lab Results  Component Value Date   ETH 201 (H) 01/24/2018   ETH <5 08/15/2014   Metabolic Disorder Labs: No results found for: HGBA1C, MPG No results found for: PROLACTIN No results found for: CHOL, TRIG, HDL, CHOLHDL, VLDL, LDLCALC  Physical Findings: AIMS: Facial and Oral Movements Muscles of Facial Expression: None, normal Lips and Perioral Area: None, normal Jaw: None, normal Tongue: None, normal,Extremity Movements Upper (arms, wrists, hands, fingers): None, normal Lower (legs, knees, ankles, toes): None, normal, Trunk Movements Neck, shoulders, hips: None, normal, Overall Severity Severity of abnormal movements (highest score from questions above): None, normal Incapacitation due to abnormal movements: None, normal Patient's awareness of abnormal movements (rate only patient's report): No Awareness, Dental Status Current problems with teeth and/or dentures?: No Does patient usually wear dentures?: No  CIWA:  CIWA-Ar Total: 7 COWS:     Musculoskeletal: Strength & Muscle Tone: within normal limits Gait & Station: normal Patient leans: N/A  Psychiatric Specialty Exam: Physical Exam  Nursing note and vitals reviewed.   Review of Systems  Constitutional: Positive for malaise/fatigue.  Respiratory: Negative for cough and shortness of breath.   Cardiovascular: Negative for chest pain and palpitations.  Gastrointestinal: Negative for abdominal pain, heartburn, nausea and vomiting.  Neurological: Negative.   Endo/Heme/Allergies: Negative.   Psychiatric/Behavioral: Positive for depression and substance abuse. Negative for suicidal ideas.    Blood pressure 105/78, pulse 100, temperature 98.3 F (36.8 C), temperature source Oral, resp. rate 18, height 5\' 10"  (1.778 m), weight 73 kg.Body mass index is 23.1 kg/m.  General Appearance: Casual and Disheveled  Eye Contact:  Poor  Speech:  Clear and Coherent and Normal Rate  Volume:  Normal  Mood:  Anxious and Depressed  Affect:  Blunt, Constricted and Depressed  Thought Process:  Coherent and Goal Directed  Orientation:  Full (Time, Place, and Person)  Thought Content:  Hallucinations: Auditory  Suicidal Thoughts:  Yes.  without intent/plan  Homicidal Thoughts:  No  Memory:  Immediate;   Fair Recent;   Fair Remote;   Fair  Judgement:  Poor  Insight:  Lacking  Psychomotor Activity:  Normal  Concentration:  Concentration: Fair  Recall:  [employer] of Knowledge:  Fair  Language:  Fair  Akathisia:  No  Handed:    AIMS (if indicated):     Assets:  Resilience Social Support  ADL's:  Intact  Cognition:  WNL  Sleep:  Number of Hours: 6.5     Treatment Plan Summary: Daily contact with patient to assess and evaluate symptoms and progress in treatment   -Admit to inpatient level of care.  -Will continue today 01/27/2018 plan as below except where it is noted.   -MDD, recurrent, severe, with psychotic features             -DC'ed seroquel              -Continue remeron 15mg  po qhs             -Continue zyprexa 10mg  po qhs  -Anxiety                        -Continue buspar 10mg  po BID             -Continue vistaril 50mg  po q6h prn anxiety.  -insomnia             -Continue trazodone 50mg  po qhs prn insomnia  -alcohol use disorder, withdrawal             -Continue CIWA with librium  -Agitation                      -Continue zydis 5mg  po q8h prn anxiety.             - - Initiated Gabapentin 200 mg po tid.  -Encourage participation in groups and therapeutic milieu  -disposition planning will be ongoing  Armandina Stammer, NP, PMHNP, FNP-BC. 01/27/2018, 11:21 AM

## 2018-01-27 NOTE — Progress Notes (Signed)
Pt in room until meds are due.  Pt c/o withdrawal symptoms and sts "I want all the meds I am due".  Pt is currently actively withdrawing from ETOH abuse.  Pt sts another trigger is his gf but does not elaborate.  Pt sts he is depressed and anxious d/t homelessness (gf kicked him out of home), substance abuse and lack of employment for several years.  Pt is med compliant and returns to room to sleep.  Pt does deny SI, HI and AVH.  Pt verbally contracts for safety.  Pt does not attend groups. Pt remains safe on unit with 15 min checks.  Pt remains safe at this time.

## 2018-01-27 NOTE — Progress Notes (Signed)
[facility] Group Notes:  (Nursing/MHT/Case Management/Adjunct)  Date:  01/27/2018  Time: 2100 Type of Therapy:  wrap up group  Participation Level:  Active  Participation Quality:  Appropriate, Attentive, Sharing and Supportive  Affect:  Appropriate and Irritable  Cognitive:  Appropriate  Insight:  Improving  Engagement in Group:  Engaged  Modes of Intervention:  Clarification, Education and Support  Summary of Progress/Problems: Pt reports today being awake and feeling better from withdrawal. Pt shared that he had a hard four days in the bed. Pt wants to go to long term treatment preferably where he can smoke cigarettes. If pt could change any one thing about his life it would be to have listened to his mother when she told him not to drink because alcoholism runs in the family. Pt began drinking at age 54. Pt is grateful for his fiance.   Marcille Buffy 01/27/2018, 9:42 PM

## 2018-01-28 LAB — LIPID PANEL
Cholesterol: 149 mg/dL (ref 0–200)
HDL: 32 mg/dL — ABNORMAL LOW (ref >40)
LDL CALC: 84 mg/dL (ref 0–99)
Total CHOL/HDL Ratio: 4.7 RATIO
Triglycerides: 165 mg/dL — ABNORMAL HIGH (ref <150)
VLDL: 33 mg/dL (ref 0–40)

## 2018-01-28 LAB — HEMOGLOBIN A1C
HEMOGLOBIN A1C: 4.4 % — AB (ref 4.8–5.6)
Mean Plasma Glucose: 79.58 mg/dL

## 2018-01-28 LAB — TSH: TSH: 2.784 u[IU]/mL (ref 0.350–4.500)

## 2018-01-28 MED ORDER — GABAPENTIN 300 MG PO CAPS
300.0000 mg | ORAL_CAPSULE | Freq: Three times a day (TID) | ORAL | Status: DC
  Administered 2018-01-28: 300 mg via ORAL
  Filled 2018-01-28 (×6): qty 1

## 2018-01-28 MED ORDER — NICOTINE POLACRILEX 2 MG MT GUM: 2.0000 mg | CHEWING_GUM | OROMUCOSAL | Status: DC | PRN

## 2018-01-28 MED ORDER — PRAZOSIN HCL 1 MG PO CAPS
1.0000 mg | ORAL_CAPSULE | Freq: Every day | ORAL | Status: DC
  Filled 2018-01-28 (×2): qty 1

## 2018-01-28 MED ORDER — ZOLPIDEM TARTRATE 5 MG PO TABS: 5.0000 mg | ORAL_TABLET | Freq: Every evening | ORAL | Status: DC | PRN

## 2018-01-28 MED ORDER — FLUPHENAZINE HCL 5 MG PO TABS
5.0000 mg | ORAL_TABLET | Freq: Two times a day (BID) | ORAL | Status: DC
  Filled 2018-01-28 (×5): qty 1

## 2018-01-28 NOTE — BHH Suicide Risk Assessment (Signed)
[facility] Discharge Suicide Risk Assessment   Principal Problem: Polysubstance dependence including opioid type drug, continuous use [facility]) Discharge Diagnoses:  Patient Active Problem List   Diagnosis Date Noted  . Polysubstance dependence including opioid type drug, continuous use (HCC) [F11.20, F19.20] 01/26/2018  . Substance induced mood disorder (HCC) [F19.94] 01/26/2018  . Severe recurrent major depression w/psychotic features, mood-congruent (HCC) [F33.3] 01/25/2018  . Alcohol use disorder, severe, dependence (HCC) [F10.20] 08/09/2014  . Alcohol abuse [F10.10] 03/15/2014  . Thalassemia [D56.9]     Total Time spent with patient: 30 minutes  Musculoskeletal: Strength & Muscle Tone: within normal limits Gait & Station: normal Patient leans: N/A  Psychiatric Specialty Exam: Review of Systems  Constitutional: Negative for chills and fever.  Respiratory: Negative for cough and shortness of breath.   Cardiovascular: Negative for chest pain.  Gastrointestinal: Negative for abdominal pain, heartburn, nausea and vomiting.  Psychiatric/Behavioral: Negative for depression, hallucinations and suicidal ideas. The patient is not nervous/anxious and does not have insomnia.     Blood pressure 133/77, pulse 87, temperature 98.2 F (36.8 C), temperature source Oral, resp. rate 16, height 5\' 10"  (1.778 m), weight 73 kg.Body mass index is 23.1 kg/m.  General Appearance: Casual and Fairly Groomed  Patent attorney::  Good  Speech:  Clear and Coherent and Normal Rate  Volume:  Normal  Mood:  Euthymic  Affect:  Appropriate, Congruent and Constricted  Thought Process:  Coherent and Goal Directed  Orientation:  Full (Time, Place, and Person)  Thought Content:  Logical  Suicidal Thoughts:  No  Homicidal Thoughts:  No  Memory:  Immediate;   Fair Recent;   Fair Remote;   Fair  Judgement:  Fair  Insight:  Lacking  Psychomotor Activity:  Normal  Concentration:  Good  Recall:  Good  Fund of  Knowledge:Good  Language: Good  Akathisia:  No  Handed:    AIMS (if indicated):     Assets:  Communication Skills Resilience Social Support  Sleep:  Number of Hours: 2.5  Cognition: WNL  ADL's:  Intact   Mental Status Per Nursing Assessment::   On Admission:  Self-harm behaviors  Demographic Factors:  Male, Caucasian, Low socioeconomic status, Living alone and Unemployed  Loss Factors: Financial problems/change in socioeconomic status  Historical Factors: Impulsivity  Risk Reduction Factors:   Positive coping skills or problem solving skills  Continued Clinical Symptoms:  Severe Anxiety and/or Agitation Depression:   Severe Alcohol/Substance Abuse/Dependencies  Cognitive Features That Contribute To Risk:  None    Suicide Risk:  Minimal: No identifiable suicidal ideation.  Patients presenting with no risk factors but with morbid ruminations; may be classified as minimal risk based on the severity of the depressive symptoms  Follow-up Information    [facility] Follow up.   Specialty:  Behavioral Health Why:  Hospital follow-up on Thursday, 9/12 at 8:00AM. Please bring: Photo ID and insurance card to this appt. Thank you.  Contact information: [address] [place] [address] [phone]        [facility] Follow up on 02/09/2018.   Why:  Screening for possible admission on Wed, 9/18 at 7:45 AM. Please bring photo ID/proof of [place] residency, 30 day supply of prescribed medications, and clothing. Please note: you will have to complete all court dates prior to admission.  Contact information: [address] [place] [address] [phone]         Subjective Data:  Arthur [name] is a 47 y/o M with history  of MDD and polysubstance abuse who was [facility] from [facility] where he had initially presented after being brought in with intoxication and altered mental status, and then after discharging he returned with worsening  depression and SI without plan in the context of worsening use of illicit substances. UDS was positive for cocaine, opiates, benzodiazepines, and pt reported heavy use of alcohol. Pt had also been misusing outpatient prescription of seroquel. He was started on alcohol withdrawal protocol with librium. He was medically cleared and then transferred to [facility] for additional treatment and evaluation. He was started on trial of remeron and zyprexa.  Today upon evaluation, pt shares, "I'm not doing well - I couldn't sleep last night and the voices started back in." Pt reports poor response to zyprexa and remeron, and he had poor sleep overnight. He denies other physical complaints. He reports that his mood has improved somewhat. He denies SI/HI/AH/VH. He remains perseverating on changing his prescription back to seroquel, and discussed with patient that misusing seroquel in combination with multiple illicit substances puts him at increased risk of overdose, and we will not restart seroquel. Pt initially was in agreement to change to prolixin, but later he expressed to RN staff that he would like to discharge. He declines all psychotropic medications, and he states he plans to follow up with his outpatient provider. He plans to follow up with [facility] after he resolves his pending legal cases. He was able to engage in safety planning including plan to return to [facility] or contact emergency services if he feels unable to maintain his own safety or the safety of others. Pt had no further questions, comments, or concerns.   Plan Of Care/Follow-up recommendations:   -Discharge to outpatient level of care   -MDD, recurrent, severe, with psychotic features             -DC remeron (pt refusing)             -DC zyprexa (pt refusing)  -alcohol use disorder, withdrawal             -DC CIWA with librium  Activity:  as tolerated Diet:  normal Tests:  NA Other:  see above for DC plan  Micheal Likens,  MD 01/28/2018, 11:40 AM

## 2018-01-28 NOTE — Progress Notes (Signed)
  [facility] Adult Case Management Discharge Plan :  Will you be returning to the same living situation after discharge:  Yes  At discharge, do you have transportation home?: Yes,  bus pass Do you have the ability to pay for your medications: No.  Release of information consent forms completed and in the chart;  Patient's signature needed at discharge.  Patient to Follow up at: Follow-up Information    [facility] Follow up.   Specialty:  Behavioral Health Why:  Hospital follow-up on Thursday, 9/12 at 8:00AM. Please bring: Photo ID and insurance card to this appt. Thank you.  Contact information: [address] [place] [address] [phone]        [facility] Follow up on 02/09/2018.   Why:  Screening for possible admission on Wed, 9/18 at 7:45 AM. Please bring photo ID/proof of [place] residency, 30 day supply of prescribed medications, and clothing. Please note: you will have to complete all court dates prior to admission.  Contact information: [address] [place] [address] [phone]           Next level of care provider has access to [facility] Link:yes  Safety Planning and Suicide Prevention discussed: Yes,  with the patient's girlfriend  Have you used any form of tobacco in the last 30 days? (Cigarettes, Smokeless Tobacco, Cigars, and/or Pipes): Yes  Has patient been referred to the Quitline?: Patient refused referral  Patient has been referred for addiction treatment: Yes  Maeola Sarah, LCSWA 01/28/2018, 2:57 PM

## 2018-01-28 NOTE — Discharge Summary (Addendum)
Physician Discharge Summary Note  Patient:  Arthur [name] is an 47 y.o., male  MRN:  [id number]  DOB:  1970-07-06  Patient phone:  [phone] (home)   Patient address:   [place] [address],   Total Time spent with patient: Greater than 30 minutes  Date of Admission:  01/25/2018  Date of Discharge: 01-28-2018  Reason for Admission: Illicit drug intoxication and altered mental status, and then after discharging he returned with worsening depression and SI without plan in the context of worsening use of illicit substances.   Discharge Diagnoses:   Principal Problem:   Polysubstance dependence including opioid type drug, continuous use (HCC) Active Problems:   Substance induced mood disorder (HCC)   Severe recurrent major depression w/psychotic features, mood-congruent [facility])  Psychiatric Specialty Exam: Physical Exam  Vitals reviewed. Constitutional: He is oriented to person, place, and time. He appears well-developed.  HENT:  Head: Normocephalic.  Eyes: Pupils are equal, round, and reactive to light.  Neck: Normal range of motion.  Cardiovascular: Normal rate.  Respiratory: Effort normal.  GI: Soft.  Genitourinary:  Genitourinary Comments: Deferred  Musculoskeletal: Normal range of motion.  Neurological: He is alert and oriented to person, place, and time.  Skin: Skin is warm and dry.  Psychiatric: He has a normal mood and affect. His behavior is normal. Judgment and thought content normal.    Review of Systems  Constitutional: Negative.   HENT: Negative.   Eyes: Negative.   Respiratory: Negative.   Cardiovascular: Negative.   Gastrointestinal: Negative.   Genitourinary: Negative.   Musculoskeletal: Negative.   Skin: Negative.   Neurological: Negative.   Endo/Heme/Allergies: Negative.   Psychiatric/Behavioral: Positive for depression (Stable.) and substance abuse (Hx. Polysubstance use disorder.). Negative for hallucinations, memory loss and  suicidal ideas. The patient is not nervous/anxious and does not have insomnia.     Blood pressure 133/77, pulse 87, temperature 98.2 F (36.8 C), temperature source Oral, resp. rate 16, height 5\' 10"  (1.778 m), weight 73 kg.Body mass index is 23.1 kg/m.  See Md's SRA.  Musculoskeletal: Strength & Muscle Tone: within normal limits Gait & Station: normal Patient leans: N/A  Hospital Course: (Per Md's discharge SRA):  Arthur [name] is a 47 y/o M with history of MDD and polysubstance abuse who was [facility] from [facility] where he had initially presented after being brought in with intoxication and altered mental status, and then after discharging he returned with worsening depression and SI without plan in the context of worsening use of illicit substances. UDS was positive for cocaine, opiates, benzodiazepines, and pt reported heavy use of alcohol. Pt had also been misusing outpatient prescription of seroquel. He was started on alcohol withdrawal protocol with librium. He was medically cleared and then transferred to [facility] for additional treatment and evaluation. He was started on trial of remeron and zyprexa.  Today upon evaluation, pt shares, "I'm not doing well - I couldn't sleep last night and the voices started back in." Pt reports poor response to zyprexa and remeron, and he had poor sleep overnight. He denies other physical complaints. He reports that his mood has improved somewhat. He denies SI/HI/AH/VH. He remains perseverating on changing his prescription back to seroquel, and discussed with patient that misusing seroquel in combination with multiple illicit substances puts him at increased risk of overdose, and we will not restart seroquel. Pt initially was in agreement to change to prolixin, but later he expressed to RN staff that he would like to discharge. He  declines all psychotropic medications, and he states he plans to follow up with his outpatient provider. He plans to follow up  with [facility] after he resolves his pending legal cases. He was able to engage in safety planning including plan to return to [facility] or contact emergency services if he feels unable to maintain his own safety or the safety of others. Pt had no further questions, comments, or concerns.  Plan Of Care/Follow-up recommendations:   -Discharge to outpatient level of care   -MDD, recurrent, severe, with psychotic features -DC remeron (pt refusing) -DC zyprexa (pt refusing)  -alcohol use disorder, withdrawal -DC CIWA with librium  Activity:  as tolerated Diet:  normal Tests:  NA Other:  see above for DC plan   Discharge Vitals:   Blood pressure 133/77, pulse 87, temperature 98.2 F (36.8 C), temperature source Oral, resp. rate 16, height 5\' 10"  (1.778 m), weight 73 kg. Body mass index is 23.1 kg/m.  Lab Results:   Results for orders placed or performed during the hospital encounter of 01/25/18 (from the past 72 hour(s))  TSH     Status: None   Collection Time: 01/28/18  6:33 AM  Result Value Ref Range   TSH 2.784 0.350 - 4.500 uIU/mL    Comment: Performed by a 3rd Generation assay with a functional sensitivity of <=0.01 uIU/mL. Performed at [facility], [address], [place], [place] [address]   Lipid panel     Status: Abnormal   Collection Time: 01/28/18  6:33 AM  Result Value Ref Range   Cholesterol 149 0 - 200 mg/dL   Triglycerides 604 (H) <150 mg/dL   HDL 32 (L) >54 mg/dL   Total CHOL/HDL Ratio 4.7 RATIO   VLDL 33 0 - 40 mg/dL   LDL Cholesterol 84 0 - 99 mg/dL    Comment:        Total Cholesterol/HDL:CHD Risk Coronary Heart Disease Risk Table                     Men   Women  1/2 Average Risk   3.4   3.3  Average Risk       5.0   4.4  2 X Average Risk   9.6   7.1  3 X Average Risk  23.4   11.0        Use the calculated Patient Ratio above and the CHD Risk Table to determine the patient's CHD Risk.         ATP III CLASSIFICATION (LDL):  <100     mg/dL   Optimal  098-119  mg/dL   Near or Above                    Optimal  130-159  mg/dL   Borderline  147-829  mg/dL   High  >562     mg/dL   Very High Performed at [facility], [address], [place], [place] [address]   Hemoglobin A1c     Status: Abnormal   Collection Time: 01/28/18  6:33 AM  Result Value Ref Range   Hgb A1c MFr Bld 4.4 (L) 4.8 - 5.6 %    Comment: (NOTE) Pre diabetes:          5.7%-6.4% Diabetes:              >6.4% Glycemic control for   <7.0% adults with diabetes    Mean Plasma Glucose 79.58 mg/dL  Comment: Performed at [facility], [address], [place], [place] [address]   Physical Findings: AIMS: Facial and Oral Movements Muscles of Facial Expression: None, normal Lips and Perioral Area: None, normal Jaw: None, normal Tongue: None, normal,Extremity Movements Upper (arms, wrists, hands, fingers): None, normal Lower (legs, knees, ankles, toes): None, normal, Trunk Movements Neck, shoulders, hips: None, normal, Overall Severity Severity of abnormal movements (highest score from questions above): None, normal Incapacitation due to abnormal movements: None, normal Patient's awareness of abnormal movements (rate only patient's report): No Awareness, Dental Status Current problems with teeth and/or dentures?: No Does patient usually wear dentures?: No  CIWA:  CIWA-Ar Total: 1 COWS:     Psychiatric Specialty Exam: See Psychiatric Specialty Exam and Suicide Risk Assessment completed by Attending Physician prior to discharge.  Discharge destination:  Home  Is patient on multiple antipsychotic therapies at discharge:  No   Has Patient had three or more failed trials of antipsychotic monotherapy by history:  No  Recommended Plan for Multiple Antipsychotic Therapies: NA  Allergies as of 01/28/2018      Reactions   Haldol [haloperidol Lactate] Other (See Comments)   LOCK JAW    Risperidone And Related Other (See Comments)   Prefers not to take -- "makes you grow titties"      Medication List    STOP taking these medications   busPIRone 30 MG tablet Commonly known as:  BUSPAR   escitalopram 10 MG tablet Commonly known as:  LEXAPRO   folic acid 1 MG tablet Commonly known as:  FOLVITE   hydrOXYzine 25 MG tablet Commonly known as:  ATARAX/VISTARIL   lisdexamfetamine 40 MG capsule Commonly known as:  VYVANSE   montelukast 10 MG tablet Commonly known as:  SINGULAIR   naproxen 500 MG tablet Commonly known as:  NAPROSYN   QUEtiapine 400 MG tablet Commonly known as:  SEROQUEL   traZODone 50 MG tablet Commonly known as:  DESYREL      Follow-up Information    [facility] Follow up.   Specialty:  Behavioral Health Why:  Hospital follow-up on Thursday, 9/12 at 8:00AM. Please bring: Photo ID and insurance card to this appt. Thank you.  Contact information: [address] [place] [address] [phone]        [facility] Follow up on 02/09/2018.   Why:  Screening for possible admission on Wed, 9/18 at 7:45 AM. Please bring photo ID/proof of [place] residency, 30 day supply of prescribed medications, and clothing. Please note: you will have to complete all court dates prior to admission.  Contact information: [address] [place] [address] [phone]          Follow-up recommendations: Activity:  As tolerated Diet: As recommended by your primary care doctor. Keep all scheduled follow-up appointments as recommended.    Comments: Patient is instructed prior to discharge to: Take all medications as prescribed by his/her mental healthcare provider. Report any adverse effects and or reactions from the medicines to his/her outpatient provider promptly. Patient has been instructed & cautioned: To not engage in alcohol and or illegal drug use while on prescription medicines. In the event of worsening symptoms,  patient is instructed to call the crisis hotline, 911 and or go to the nearest ED for appropriate evaluation and treatment of symptoms. To follow-up with his/her primary care provider for your other medical issues, concerns and or health care needs.     Signed: Armandina Stammer MAY,  PMHNP,  FNP-BC 01/28/2018, 11:41 AM   Patient seen, Suicide Assessment Completed.  Disposition Plan Reviewed

## 2018-01-28 NOTE — Progress Notes (Signed)
Patient ID: Arthur [name], male   DOB: Jun 15, 1970, 47 y.o.   MRN: [id number] D: Patient reports Seroquel is the only medication that help him sleep and is very disappointed is d/ced. Pt is preoccupied that nothing will help him sleep than Seroquel. pt  denies SI/HI/AVH and pain. Pt attended and participated in evening wrap up group. Cooperative with assessment. No acute distressed noted at this time.   A: scheduled and prn medications administered as prescribed. Support and encouragement provided as needed.   R: Patient remains safe and complaint with medications.

## 2018-01-28 NOTE — Progress Notes (Signed)
Adult Psychoeducational Group Note  Date:  01/28/2018 Time:  9:39 AM  Group Topic/Focus:  Orientation:   The focus of this group is to educate the patient on the purpose and policies of crisis stabilization and provide a format to answer questions about their admission.  The group details unit policies and expectations of patients while admitted.  Participation Level:  Active  Participation Quality:  Appropriate  Affect:  Appropriate  Cognitive:  Alert  Insight: Appropriate  Engagement in Group:  Engaged  Modes of Intervention:  Discussion and Education  Additional Comments:     Arthur [name] G Grant Henkes 01/28/2018, 9:39 AM 

## 2018-01-28 NOTE — Plan of Care (Signed)
  Problem: Education: Goal: Emotional status will improve Outcome: Not Progressing   

## 2018-01-28 NOTE — Progress Notes (Addendum)
Pt is dead-set on receiving po seroquel during this hospitalization, despite the fact that he just recently overdosed on seroquel ( this is what preciptated his admision to this hospital). HE  Exhibits  Negative thnking and behaving ,   he , argues that he is " ready to go....you all cannot help me .He shares he has oustanding warrants for his arrest in [facility] and that he is anxious to get these resolved / taken care of. He completed his daily assessment and on this he wrote  He deneid having SI today and he rated his depression, hopelessness and anxiety " 7/7/7/', respectively. His dc teaching was completed by EA RN and he was given cc of f/u instructions ( SRA, AVS, SSP and transition record). . ALl belongins in his locker were returned to him and he was accompanied to the bldg lobby-where he dc'd ambulatory in his own care. HE refused any meds.

## 2019-09-04 ENCOUNTER — Inpatient Hospital Stay (HOSPITAL_COMMUNITY)
Admission: EM | Admit: 2019-09-04 | Discharge: 2019-09-23 | DRG: 441 | Disposition: E | Payer: Medicaid Other | Attending: Emergency Medicine | Admitting: Emergency Medicine

## 2019-09-04 ENCOUNTER — Inpatient Hospital Stay (HOSPITAL_COMMUNITY): Payer: Medicaid Other

## 2019-09-04 ENCOUNTER — Other Ambulatory Visit: Payer: Self-pay

## 2019-09-04 DIAGNOSIS — K760 Fatty (change of) liver, not elsewhere classified: Secondary | ICD-10-CM | POA: Diagnosis present

## 2019-09-04 DIAGNOSIS — D62 Acute posthemorrhagic anemia: Secondary | ICD-10-CM | POA: Diagnosis present

## 2019-09-04 DIAGNOSIS — D569 Thalassemia, unspecified: Secondary | ICD-10-CM | POA: Diagnosis present

## 2019-09-04 DIAGNOSIS — K7011 Alcoholic hepatitis with ascites: Secondary | ICD-10-CM | POA: Diagnosis present

## 2019-09-04 DIAGNOSIS — R188 Other ascites: Secondary | ICD-10-CM | POA: Diagnosis present

## 2019-09-04 DIAGNOSIS — E8809 Other disorders of plasma-protein metabolism, not elsewhere classified: Secondary | ICD-10-CM | POA: Diagnosis present

## 2019-09-04 DIAGNOSIS — D684 Acquired coagulation factor deficiency: Secondary | ICD-10-CM | POA: Diagnosis present

## 2019-09-04 DIAGNOSIS — R601 Generalized edema: Secondary | ICD-10-CM | POA: Diagnosis not present

## 2019-09-04 DIAGNOSIS — D72829 Elevated white blood cell count, unspecified: Secondary | ICD-10-CM | POA: Diagnosis present

## 2019-09-04 DIAGNOSIS — Y909 Presence of alcohol in blood, level not specified: Secondary | ICD-10-CM | POA: Diagnosis present

## 2019-09-04 DIAGNOSIS — Z789 Other specified health status: Secondary | ICD-10-CM

## 2019-09-04 DIAGNOSIS — K7040 Alcoholic hepatic failure without coma: Secondary | ICD-10-CM | POA: Diagnosis present

## 2019-09-04 DIAGNOSIS — E876 Hypokalemia: Secondary | ICD-10-CM | POA: Diagnosis present

## 2019-09-04 DIAGNOSIS — F329 Major depressive disorder, single episode, unspecified: Secondary | ICD-10-CM | POA: Diagnosis present

## 2019-09-04 DIAGNOSIS — F17210 Nicotine dependence, cigarettes, uncomplicated: Secondary | ICD-10-CM | POA: Diagnosis present

## 2019-09-04 DIAGNOSIS — R161 Splenomegaly, not elsewhere classified: Secondary | ICD-10-CM | POA: Diagnosis present

## 2019-09-04 DIAGNOSIS — G8929 Other chronic pain: Secondary | ICD-10-CM | POA: Diagnosis present

## 2019-09-04 DIAGNOSIS — I4581 Long QT syndrome: Secondary | ICD-10-CM | POA: Diagnosis present

## 2019-09-04 DIAGNOSIS — R17 Unspecified jaundice: Secondary | ICD-10-CM | POA: Diagnosis present

## 2019-09-04 DIAGNOSIS — K7031 Alcoholic cirrhosis of liver with ascites: Secondary | ICD-10-CM | POA: Diagnosis present

## 2019-09-04 DIAGNOSIS — I10 Essential (primary) hypertension: Secondary | ICD-10-CM | POA: Diagnosis present

## 2019-09-04 DIAGNOSIS — F209 Schizophrenia, unspecified: Secondary | ICD-10-CM | POA: Diagnosis present

## 2019-09-04 DIAGNOSIS — E871 Hypo-osmolality and hyponatremia: Secondary | ICD-10-CM

## 2019-09-04 DIAGNOSIS — E875 Hyperkalemia: Secondary | ICD-10-CM | POA: Diagnosis present

## 2019-09-04 DIAGNOSIS — R109 Unspecified abdominal pain: Secondary | ICD-10-CM

## 2019-09-04 DIAGNOSIS — R7401 Elevation of levels of liver transaminase levels: Secondary | ICD-10-CM

## 2019-09-04 DIAGNOSIS — Z1389 Encounter for screening for other disorder: Secondary | ICD-10-CM

## 2019-09-04 DIAGNOSIS — N179 Acute kidney failure, unspecified: Secondary | ICD-10-CM | POA: Diagnosis not present

## 2019-09-04 DIAGNOSIS — E222 Syndrome of inappropriate secretion of antidiuretic hormone: Secondary | ICD-10-CM | POA: Diagnosis present

## 2019-09-04 DIAGNOSIS — Z79899 Other long term (current) drug therapy: Secondary | ICD-10-CM

## 2019-09-04 DIAGNOSIS — Z66 Do not resuscitate: Secondary | ICD-10-CM | POA: Diagnosis not present

## 2019-09-04 DIAGNOSIS — F10239 Alcohol dependence with withdrawal, unspecified: Secondary | ICD-10-CM | POA: Diagnosis present

## 2019-09-04 DIAGNOSIS — N170 Acute kidney failure with tubular necrosis: Secondary | ICD-10-CM | POA: Diagnosis present

## 2019-09-04 DIAGNOSIS — K828 Other specified diseases of gallbladder: Secondary | ICD-10-CM | POA: Diagnosis present

## 2019-09-04 DIAGNOSIS — K709 Alcoholic liver disease, unspecified: Secondary | ICD-10-CM | POA: Diagnosis present

## 2019-09-04 DIAGNOSIS — Z515 Encounter for palliative care: Secondary | ICD-10-CM | POA: Diagnosis not present

## 2019-09-04 DIAGNOSIS — Z888 Allergy status to other drugs, medicaments and biological substances status: Secondary | ICD-10-CM | POA: Diagnosis not present

## 2019-09-04 DIAGNOSIS — Z9114 Patient's other noncompliance with medication regimen: Secondary | ICD-10-CM

## 2019-09-04 DIAGNOSIS — J9601 Acute respiratory failure with hypoxia: Secondary | ICD-10-CM | POA: Diagnosis not present

## 2019-09-04 DIAGNOSIS — M545 Low back pain: Secondary | ICD-10-CM | POA: Diagnosis present

## 2019-09-04 DIAGNOSIS — Z20822 Contact with and (suspected) exposure to covid-19: Secondary | ICD-10-CM | POA: Diagnosis present

## 2019-09-04 DIAGNOSIS — D509 Iron deficiency anemia, unspecified: Secondary | ICD-10-CM | POA: Diagnosis present

## 2019-09-04 DIAGNOSIS — F1020 Alcohol dependence, uncomplicated: Secondary | ICD-10-CM | POA: Diagnosis not present

## 2019-09-04 DIAGNOSIS — R34 Anuria and oliguria: Secondary | ICD-10-CM | POA: Diagnosis not present

## 2019-09-04 DIAGNOSIS — K7290 Hepatic failure, unspecified without coma: Secondary | ICD-10-CM | POA: Diagnosis present

## 2019-09-04 DIAGNOSIS — N289 Disorder of kidney and ureter, unspecified: Secondary | ICD-10-CM | POA: Diagnosis not present

## 2019-09-04 DIAGNOSIS — K7682 Hepatic encephalopathy: Secondary | ICD-10-CM | POA: Diagnosis present

## 2019-09-04 DIAGNOSIS — E878 Other disorders of electrolyte and fluid balance, not elsewhere classified: Secondary | ICD-10-CM | POA: Diagnosis present

## 2019-09-04 DIAGNOSIS — K767 Hepatorenal syndrome: Secondary | ICD-10-CM | POA: Diagnosis present

## 2019-09-04 DIAGNOSIS — E7220 Disorder of urea cycle metabolism, unspecified: Secondary | ICD-10-CM

## 2019-09-04 DIAGNOSIS — R278 Other lack of coordination: Secondary | ICD-10-CM | POA: Diagnosis present

## 2019-09-04 DIAGNOSIS — Z4659 Encounter for fitting and adjustment of other gastrointestinal appliance and device: Secondary | ICD-10-CM

## 2019-09-04 DIAGNOSIS — D638 Anemia in other chronic diseases classified elsewhere: Secondary | ICD-10-CM | POA: Diagnosis present

## 2019-09-04 DIAGNOSIS — F1190 Opioid use, unspecified, uncomplicated: Secondary | ICD-10-CM | POA: Diagnosis present

## 2019-09-04 LAB — COMPREHENSIVE METABOLIC PANEL
ALT: 40 U/L (ref 0–44)
AST: 144 U/L — ABNORMAL HIGH (ref 15–41)
Albumin: 1.9 g/dL — ABNORMAL LOW (ref 3.5–5.0)
Alkaline Phosphatase: 241 U/L — ABNORMAL HIGH (ref 38–126)
Anion gap: 20 — ABNORMAL HIGH (ref 5–15)
BUN: 25 mg/dL — ABNORMAL HIGH (ref 6–20)
CO2: 19 mmol/L — ABNORMAL LOW (ref 22–32)
Calcium: 8.5 mg/dL — ABNORMAL LOW (ref 8.9–10.3)
Chloride: 82 mmol/L — ABNORMAL LOW (ref 98–111)
Creatinine, Ser: 5.02 mg/dL — ABNORMAL HIGH (ref 0.61–1.24)
GFR calc Af Amer: 15 mL/min — ABNORMAL LOW (ref >60)
GFR calc non Af Amer: 13 mL/min — ABNORMAL LOW (ref >60)
Glucose, Bld: 96 mg/dL (ref 70–99)
Potassium: 2.5 mmol/L — CL (ref 3.5–5.1)
Sodium: 121 mmol/L — ABNORMAL LOW (ref 135–145)
Total Bilirubin: >50 mg/dL (ref 0.3–1.2)
Total Protein: 7.2 g/dL (ref 6.5–8.1)

## 2019-09-04 LAB — CBC
HCT: 19.3 % — ABNORMAL LOW (ref 39.0–52.0)
Hemoglobin: 6 g/dL — CL (ref 13.0–17.0)
MCH: 24.3 pg — ABNORMAL LOW (ref 26.0–34.0)
MCHC: 31.1 g/dL (ref 30.0–36.0)
MCV: 78.1 fL — ABNORMAL LOW (ref 80.0–100.0)
Platelets: 262 10*3/uL (ref 150–400)
RBC: 2.47 MIL/uL — ABNORMAL LOW (ref 4.22–5.81)
RDW: 20.1 % — ABNORMAL HIGH (ref 11.5–15.5)
WBC: 26.6 10*3/uL — ABNORMAL HIGH (ref 4.0–10.5)
nRBC: 25.3 % — ABNORMAL HIGH (ref 0.0–0.2)

## 2019-09-04 LAB — LIPASE, BLOOD: Lipase: 28 U/L (ref 11–51)

## 2019-09-04 LAB — PROTIME-INR
INR: 1.5 — ABNORMAL HIGH (ref 0.8–1.2)
Prothrombin Time: 18.4 seconds — ABNORMAL HIGH (ref 11.4–15.2)

## 2019-09-04 LAB — MAGNESIUM: Magnesium: 1.6 mg/dL — ABNORMAL LOW (ref 1.7–2.4)

## 2019-09-04 LAB — AMMONIA: Ammonia: 91 umol/L — ABNORMAL HIGH (ref 9–35)

## 2019-09-04 LAB — HEPATITIS PANEL, ACUTE
HCV Ab: NONREACTIVE
Hep A IgM: NONREACTIVE
Hep B C IgM: NONREACTIVE
Hepatitis B Surface Ag: NONREACTIVE

## 2019-09-04 LAB — PREPARE RBC (CROSSMATCH)

## 2019-09-04 LAB — ABO/RH: ABO/RH(D): O NEG

## 2019-09-04 LAB — ETHANOL

## 2019-09-04 MED ORDER — POTASSIUM CHLORIDE 10 MEQ/100ML IV SOLN
10.0000 meq | INTRAVENOUS | Status: AC
  Administered 2019-09-04 (×2): 10 meq via INTRAVENOUS
  Filled 2019-09-04 (×2): qty 100

## 2019-09-04 MED ORDER — FOLIC ACID 1 MG PO TABS
1.0000 mg | ORAL_TABLET | Freq: Once | ORAL | Status: AC
  Administered 2019-09-04: 20:00:00 1 mg via ORAL
  Filled 2019-09-04: qty 1

## 2019-09-04 MED ORDER — SODIUM CHLORIDE 0.9 % IV SOLN
1.0000 g | Freq: Once | INTRAVENOUS | Status: AC
  Administered 2019-09-04: 22:00:00 1 g via INTRAVENOUS
  Filled 2019-09-04: qty 10

## 2019-09-04 MED ORDER — SODIUM CHLORIDE 0.9% FLUSH
3.0000 mL | Freq: Once | INTRAVENOUS | Status: AC
  Administered 2019-09-04: 3 mL via INTRAVENOUS

## 2019-09-04 MED ORDER — ALBUMIN HUMAN 5 % IV SOLN
25.0000 g | Freq: Three times a day (TID) | INTRAVENOUS | Status: AC
  Administered 2019-09-05 (×4): 25 g via INTRAVENOUS
  Filled 2019-09-04 (×6): qty 500

## 2019-09-04 MED ORDER — MAGNESIUM SULFATE 2 GM/50ML IV SOLN
2.0000 g | Freq: Once | INTRAVENOUS | Status: AC
  Administered 2019-09-04: 2 g via INTRAVENOUS
  Filled 2019-09-04: qty 50

## 2019-09-04 MED ORDER — SODIUM CHLORIDE 0.9 % IV SOLN
10.0000 mL/h | Freq: Once | INTRAVENOUS | Status: AC
  Administered 2019-09-04: 22:00:00 10 mL/h via INTRAVENOUS

## 2019-09-04 MED ORDER — POTASSIUM CHLORIDE CRYS ER 20 MEQ PO TBCR
40.0000 meq | EXTENDED_RELEASE_TABLET | Freq: Once | ORAL | Status: AC
  Administered 2019-09-04: 22:00:00 40 meq via ORAL
  Filled 2019-09-04: qty 2

## 2019-09-04 MED ORDER — THIAMINE HCL 100 MG/ML IJ SOLN
100.0000 mg | Freq: Once | INTRAMUSCULAR | Status: AC
  Administered 2019-09-04: 20:00:00 100 mg via INTRAVENOUS
  Filled 2019-09-04: qty 2

## 2019-09-04 NOTE — ED Notes (Signed)
Boneta Lucks, care taker, [phone] would like an update when available

## 2019-09-04 NOTE — Consult Note (Signed)
Arthur [name] Admit Date: 10-01-2019 10/01/2019 Arita Miss Requesting Physician:  Lars Masson MD  Reason for Consult:  AKI HPI:  82M PMH heavy alcohol use, unclear psychiatric disease on Seroquel, presented to the ER with severe jaundice.  Patient has been drinking heavily for some time, using 1/2 gallon of vodka every 1 to 3 days.  Potentially increasing that amount lately.  He has not been eating very much over the past week.  Historically has a normal GFR.  Presented with metabolic disarray including sodium 121, potassium 2.5, creatinine 5.0.  LFTs notable for total bilirubin greater than 50, AST of 144, albumin 1.9, alkaline phosphatase of 241, INR 1.5.  CBC with WBC of 26.6, hemoglobin 6.0 with MCV of 78, platelets of 262.  Patient is unclear if he desires to stop drinking.  He denies use of NSAIDs prior to admission.  No use of ACE inhibitor or ARB.  Significant abdominal distention, some firmness but no overt tenderness.  No recent fevers.  No significant peripheral edema.  Other than jaundice, no other rashes.  Pt states more difficulty / less UOP lately.  In ED treatment has included potassium chloride, ceftriaxone, magnesium.   Creatinine, Ser (mg/dL)  Date Value  20/25/4270 5.02 (H)  01/24/2018 0.77  08/15/2014 0.68  08/08/2014 0.85  03/15/2014 0.59  03/15/2014 0.59  ] ROS NSAIDS: Denies use IV Contrast no exposure TMP/SMX denies use Balance of 12 systems is negative w/ exceptions as above  PMH  Past Medical History:  Diagnosis Date  . ETOH abuse   . Heart murmur   . Hypertension   . Panic attack   . Thalassemia    PSH No past surgical history on file. FH  Family History  Problem Relation Age of Onset  . Cancer Mother   . Mental illness Mother   . Mental illness Father    SH  reports that he has been smoking. He has been smoking about 1.00 pack per day. He has never used smokeless tobacco. He reports current alcohol use of about 25.0 standard drinks of  alcohol per week. He reports current drug use. Drug: Marijuana. Allergies  Allergies  Allergen Reactions  . Haldol [Haloperidol Lactate] Other (See Comments)    LOCK JAW  . Risperidone And Related Other (See Comments)    Prefers not to take -- "makes you grow titties"   Home medications Prior to Admission medications   Medication Sig Start Date End Date Taking? Authorizing Provider  QUEtiapine (SEROQUEL) 400 MG tablet Take 400 mg by mouth at bedtime.   Yes [provider]    Current Medications Scheduled Meds: Continuous Infusions: . magnesium sulfate bolus IVPB    . potassium chloride 10 mEq (2019/10/01 2250)   PRN Meds:.  CBC Recent Labs  Lab 10-01-2019 1954  WBC 26.6*  HGB 6.0*  HCT 19.3*  MCV 78.1*  PLT 262   Basic Metabolic Panel Recent Labs  Lab Oct 01, 2019 1954  NA 121*  K 2.5*  CL 82*  CO2 19*  GLUCOSE 96  BUN 25*  CREATININE 5.02*  CALCIUM 8.5*    Physical Exam  Blood pressure 119/70, pulse (!) 106, temperature 98.1 F (36.7 C), temperature source Oral, resp. rate 18, height 5\' 10"  (1.778 m), weight 68 kg, SpO2 95 %. GEN: Chronically ill-appearing, severely jaundiced ENT: NCAT EYES: Icteric sclera CV: Tachycardic, regular PULM: Clear bilaterally ABD: Distended, firm, very mild tenderness, hypoactive bowel sounds SKIN: As above EXT: No peripheral edema Asterixis present  Assessment  54M with severe jaundice, likely alcoholic hepatitis, AKI, hyponatremia, hypokalemia  1. Severe AKI, normal baseline GFR; likely etiologies include hepatorenal process, bile cast nephropathy, ATN, unlikely patient is prerenal/hypovolemic. 2. Hyponatremia: Could be hypervolemic hyponatremia as a consequence of cirrhosis/liver failure.  Also possible would be low solute/impaired free water excretion hyponatremia.  Urine indices will be helpful here. 3. Alcoholic hepatitis, hyperbilirubinemia, ongoing heavy alcohol use, per primary and GI 4. Microcytic anemia,  transfused in the ED 5. Unclear psychiatric disorder, on Seroquel 6. Hypokalemia, probably from inadequate intake, repleted in the ED, trend 7. Leukocytosis, probably will need evaluation for SBP, per TRH  Plan 1. Start albumin 25g TID 2. UNa, UOsm, SOsm 3. UA and UP/C 4. Consider diagnostic paracentesis 5. Trend BMP q6h 6. Would restrict total fluids to 1.5L 7. Very poor prognosis, further he is unclear if he intends to stop   Rexene Agent  [phone] 09/05/19, 10:51 PM

## 2019-09-04 NOTE — ED Provider Notes (Signed)
[facility] EMERGENCY DEPARTMENT Provider Note   CSN: [id number] Arrival date & time: 08/27/2019  1843     History Chief Complaint  Patient presents with  . Jaundice    Abdiel [name] is a 49 y.o. male.  HPI   49 year old male with a past medical history of alcohol abuse, hypertension presenting to the emergency department brought in by EMS from home complaining of jaundice associated with severe abdominal distention, nausea, diarrhea and fatigue.  Patient reports that he was told 2 weeks ago by his PCP that he was in liver failure.  Patient decided not to present to the emergency department for further evaluation as recommended by his PCP.  Patient reports he has had difficulty walking due to lightheadedness.  Patient notes that he drinks at least half a gallon of 90 proof vodka every 3 days.  Patient denies any fevers, chills, cough, congestion, rhinorrhea, shortness of breath, chest pain.  Patient notes having chronic low back pain.  Patient denies any abdominal pain.  Patient has had diarrhea.  Patient has not eaten in 4 days  Past Medical History:  Diagnosis Date  . ETOH abuse   . Heart murmur   . Hypertension   . Panic attack   . Thalassemia     Patient Active Problem List   Diagnosis Date Noted  . Polysubstance dependence including opioid type drug, continuous use ([place]) 01/26/2018  . Substance induced mood disorder ([place]) 01/26/2018  . Severe recurrent major depression w/psychotic features, mood-congruent ([place]) 01/25/2018  . Alcohol use disorder, severe, dependence ([place]) 08/09/2014  . Alcohol abuse 03/15/2014  . Thalassemia     No past surgical history on file.     Family History  Problem Relation Age of Onset  . Cancer Mother   . Mental illness Mother   . Mental illness Father     Social History   Tobacco Use  . Smoking status: Current Every Day Smoker    Packs/day: 1.00  . Smokeless tobacco: Never Used  Substance Use Topics  . Alcohol  use: Yes    Alcohol/week: 25.0 standard drinks    Types: 5 Cans of beer, 20 Shots of liquor per week  . Drug use: Yes    Types: Marijuana    Home Medications Prior to Admission medications   Medication Sig Start Date End Date Taking? Authorizing Provider  QUEtiapine (SEROQUEL) 400 MG tablet Take 400 mg by mouth at bedtime.   Yes [provider]    Allergies    Haldol [haloperidol lactate] and Risperidone and related  Review of Systems   Review of Systems  Constitutional: Positive for activity change and appetite change. Negative for chills, diaphoresis, fatigue and fever.  HENT: Negative for congestion and rhinorrhea.   Respiratory: Negative for cough, shortness of breath and wheezing.   Cardiovascular: Negative for chest pain.  Gastrointestinal: Positive for abdominal distention, abdominal pain, diarrhea and nausea. Negative for constipation and vomiting.  Genitourinary: Negative for decreased urine volume, difficulty urinating, dysuria, frequency and urgency.  Musculoskeletal: Positive for back pain and gait problem.  Skin: Positive for color change. Negative for wound.  Neurological: Positive for light-headedness. Negative for dizziness, syncope, weakness and headaches.  All other systems reviewed and are negative.   Physical Exam Updated Vital Signs BP 119/70   Pulse (!) 106   Temp (!) 97.5 F (36.4 C) (Oral)   Resp 17   Ht _0  (1.778 m)   Wt 68 kg   SpO2 94%  BMI 21.52 kg/m   Physical Exam Vitals and nursing note reviewed.  Constitutional:      General: He is not in acute distress.    Appearance: Normal appearance. He is normal weight. He is ill-appearing.  HENT:     Head: Normocephalic.     Right Ear: External ear normal.     Left Ear: External ear normal.     Nose: Nose normal.     Comments: Telangiectasias    Mouth/Throat:     Mouth: Mucous membranes are moist.     Pharynx: Oropharynx is clear.  Eyes:     General: Scleral icterus present.      Extraocular Movements: Extraocular movements intact.     Pupils: Pupils are equal, round, and reactive to light.  Cardiovascular:     Rate and Rhythm: Regular rhythm. Tachycardia present.     Pulses: Normal pulses.     Heart sounds: Normal heart sounds.  Pulmonary:     Effort: Pulmonary effort is normal. No respiratory distress.     Breath sounds: Normal breath sounds. No wheezing or rhonchi.  Abdominal:     General: Abdomen is protuberant. Bowel sounds are normal. There is distension.     Palpations: Abdomen is soft. There is shifting dullness, fluid wave and hepatomegaly.     Tenderness: There is no abdominal tenderness. There is no guarding or rebound.  Musculoskeletal:     Cervical back: Normal range of motion.     Right lower leg: No edema.     Left lower leg: No edema.  Skin:    General: Skin is warm and dry.     Capillary Refill: Capillary refill takes less than 2 seconds.     Coloration: Skin is jaundiced.  Neurological:     General: No focal deficit present.     Mental Status: He is alert and oriented to person, place, and time. Mental status is at baseline.     Comments: Bilateral lower extremity clonus, asterixis  Psychiatric:        Mood and Affect: Mood normal.     ED Results / Procedures / Treatments   Labs (all labs ordered are listed, but only abnormal results are displayed) Labs Reviewed  COMPREHENSIVE METABOLIC PANEL - Abnormal; Notable for the following components:      Result Value   Sodium 121 (*)    Potassium 2.5 (*)    Chloride 82 (*)    CO2 19 (*)    BUN 25 (*)    Creatinine, Ser 5.02 (*)    Calcium 8.5 (*)    Albumin 1.9 (*)    AST 144 (*)    Alkaline Phosphatase 241 (*)    Total Bilirubin >50.0 (*)    GFR calc non Af Amer 13 (*)    GFR calc Af Amer 15 (*)    Anion gap 20 (*)    All other components within normal limits  CBC - Abnormal; Notable for the following components:   WBC 26.6 (*)    RBC 2.47 (*)    Hemoglobin 6.0 (*)    HCT  19.3 (*)    MCV 78.1 (*)    MCH 24.3 (*)    RDW 20.1 (*)    nRBC 25.3 (*)    All other components within normal limits  PROTIME-INR - Abnormal; Notable for the following components:   Prothrombin Time 18.4 (*)    INR 1.5 (*)    All other components within normal limits  AMMONIA -  Abnormal; Notable for the following components:   Ammonia 91 (*)    All other components within normal limits  SARS CORONAVIRUS 2 (TAT 6-24 HRS)  LIPASE, BLOOD  ETHANOL  URINALYSIS, ROUTINE W REFLEX MICROSCOPIC  HEPATITIS PANEL, ACUTE  MAGNESIUM  TYPE AND SCREEN  PREPARE RBC (CROSSMATCH)  ABO/RH    EKG EKG Interpretation  Date/Time:  Monday September 04 2019 21:59:11 EDT Ventricular Rate:  106 PR Interval:    QRS Duration: 127 QT Interval:  354 QTC Calculation: 471 R Axis:   84 Text Interpretation: Sinus tachycardia Atrial premature complex Borderline prolonged PR interval Nonspecific intraventricular conduction delay Nonspecific T wave abnormality U waves present Confirmed by Lajean Saver [phone]) on 09/11/2019 10:00:29 PM   Radiology No results found.  Procedures Procedures (including critical care time)  Medications Ordered in ED Medications  0.9 %  sodium chloride infusion (has no administration in time range)  cefTRIAXone (ROCEPHIN) 1 g in sodium chloride 0.9 % 100 mL IVPB (1 g Intravenous New Bag/Given 08/28/2019 2138)  potassium chloride 10 mEq in 100 mL IVPB (10 mEq Intravenous New Bag/Given 09/11/2019 2149)  sodium chloride flush (NS) 0.9 % injection 3 mL (3 mLs Intravenous Given 0/34/74 2595)  folic acid (FOLVITE) tablet 1 mg (1 mg Oral Given 08/30/2019 2012)  thiamine (B-1) injection 100 mg (100 mg Intravenous Given 09/07/2019 2011)  potassium chloride SA (KLOR-CON) CR tablet 40 mEq (40 mEq Oral Given 09/03/2019 2145)    ED Course  I have reviewed the triage vital signs and the nursing notes.  Pertinent labs & imaging results that were available during my care of the patient were reviewed by  me and considered in my medical decision making (see chart for details).    MDM Rules/Calculators/A&P                      49 year old male with a past medical history of alcohol abuse, hypertension presenting to the emergency department brought in by EMS from home complaining of jaundice associated with severe abdominal distention, nausea, diarrhea and fatigue.  Differential diagnoses considered include acute hepatic failure, alcohol intoxication, uremia, Wernicke's encephalopathy, low suspicion for SBP or sepsis, electrolyte derangements, anemia, acute hepatitis  Initial interventions 1 mg folate and 100 mg of thiamine  ECG interpreted by me demonstrated Sinus tachycardia 106 bpm, normal axis, PACs, prolonged prolonged PR interval, prolonged QTC at 471 ms likely secondary to U waves fusing with a T complex secondary to hypokalemia with a potassium of 2.5, PRWP, nonspecific T wave changes in the lateral leads  Labs demonstrated lipase 28, hyponatremia 121, potassium 3.5, hypochloremia 82 consistent with beer potomania in the setting of severe alcohol abuse, bicarb diminished 19 with a BUN elevation of 25 and creatinine of 5.02, hypocalcemia 8.5, hypoalbuminemia 1.9, transaminitis with an AST of 144 and alk phos of 241, T bili greater than 50, anion gap of 20, leukocytosis to 26.6 empirically treated with ceftriaxone 1 g, microcytic anemia with hemoglobin of 6.0 and MCV of 70.1, will transfuse in the emergency department, platelets 262, synthetic dysfunction as evidenced by an INR elevated to 1.5 and a PT of 18.4, hyperammonemia to 91  Given the above findings, my suspicion is that the patient has acute hepatic failure complicated by hepatorenal syndrome with multiple severe electrolyte derangements and profound hyperbilirubinemia.  Discussed case with nephrology who will follow for consideration of acute renal replacement therapy.  The patient is safe and stable for discharge at this time with  return precautions provided and a plan for follow up care in place as needed  We will admit to hospitalist for ongoing evaluation, monitoring, management and coordination of care  The plan for this patient was discussed with Dr. Ashok Cordia, who voiced agreement and who oversaw evaluation and treatment of this patient.  Final Clinical Impression(s) / ED Diagnoses Final diagnoses:  Jaundice  Hyperbilirubinemia  Hyponatremia  Hypochloremia  Hyperammonemia ([place])  Transaminitis  Hepatorenal syndrome [facility])    Rx / DC Orders ED Discharge Orders    None       Filbert Berthold, MD 09/01/2019 2204    Lajean Saver, MD 09/14/2019 2220

## 2019-09-04 NOTE — ED Triage Notes (Signed)
[facility]. Pt states he was told 2 weeks ago by PCP he was in liver failure and needed to go to ED immediately. Pt presents jaundice, severe abd distension, reports nausea/diarrhea, lethargic.

## 2019-09-04 NOTE — ED Notes (Signed)
RN received informed consent for blood transfusion.

## 2019-09-04 NOTE — ED Notes (Signed)
Pt having multiple episodes of diarrhea. RN attempting to get pt to get in bed.

## 2019-09-04 NOTE — ED Notes (Signed)
Pt states he has been having hallucinations for over a week. He sees bugs on the walls and tries to get up to kill them. Pt states he is seeing the bugs on the walls now.

## 2019-09-04 NOTE — ED Notes (Signed)
Dr. Denton Lank notified of pt critical hemoglobin.

## 2019-09-05 ENCOUNTER — Encounter (HOSPITAL_COMMUNITY): Payer: Self-pay | Admitting: Internal Medicine

## 2019-09-05 ENCOUNTER — Inpatient Hospital Stay (HOSPITAL_COMMUNITY): Payer: Medicaid Other

## 2019-09-05 DIAGNOSIS — K7290 Hepatic failure, unspecified without coma: Secondary | ICD-10-CM

## 2019-09-05 DIAGNOSIS — F1020 Alcohol dependence, uncomplicated: Secondary | ICD-10-CM | POA: Diagnosis not present

## 2019-09-05 DIAGNOSIS — K709 Alcoholic liver disease, unspecified: Secondary | ICD-10-CM

## 2019-09-05 DIAGNOSIS — K767 Hepatorenal syndrome: Secondary | ICD-10-CM | POA: Diagnosis not present

## 2019-09-05 DIAGNOSIS — N179 Acute kidney failure, unspecified: Secondary | ICD-10-CM

## 2019-09-05 DIAGNOSIS — D509 Iron deficiency anemia, unspecified: Secondary | ICD-10-CM | POA: Diagnosis present

## 2019-09-05 DIAGNOSIS — R188 Other ascites: Secondary | ICD-10-CM | POA: Diagnosis present

## 2019-09-05 DIAGNOSIS — K7682 Hepatic encephalopathy: Secondary | ICD-10-CM | POA: Diagnosis present

## 2019-09-05 DIAGNOSIS — N289 Disorder of kidney and ureter, unspecified: Secondary | ICD-10-CM | POA: Diagnosis not present

## 2019-09-05 DIAGNOSIS — R17 Unspecified jaundice: Secondary | ICD-10-CM | POA: Diagnosis present

## 2019-09-05 DIAGNOSIS — E876 Hypokalemia: Secondary | ICD-10-CM | POA: Diagnosis present

## 2019-09-05 LAB — URINALYSIS, ROUTINE W REFLEX MICROSCOPIC
Glucose, UA: 50 mg/dL — AB
Hgb urine dipstick: NEGATIVE
Ketones, ur: NEGATIVE mg/dL
Leukocytes,Ua: NEGATIVE
Nitrite: NEGATIVE
Protein, ur: 100 mg/dL — AB
Specific Gravity, Urine: 1.02 (ref 1.005–1.030)
pH: 5 (ref 5.0–8.0)

## 2019-09-05 LAB — BASIC METABOLIC PANEL
Anion gap: 17 — ABNORMAL HIGH (ref 5–15)
Anion gap: 16 — ABNORMAL HIGH (ref 5–15)
Anion gap: 18 — ABNORMAL HIGH (ref 5–15)
BUN: 29 mg/dL — ABNORMAL HIGH (ref 6–20)
BUN: 30 mg/dL — ABNORMAL HIGH (ref 6–20)
BUN: 32 mg/dL — ABNORMAL HIGH (ref 6–20)
CO2: 19 mmol/L — ABNORMAL LOW (ref 22–32)
CO2: 19 mmol/L — ABNORMAL LOW (ref 22–32)
CO2: 18 mmol/L — ABNORMAL LOW (ref 22–32)
Calcium: 8.4 mg/dL — ABNORMAL LOW (ref 8.9–10.3)
Calcium: 8.2 mg/dL — ABNORMAL LOW (ref 8.9–10.3)
Calcium: 8.2 mg/dL — ABNORMAL LOW (ref 8.9–10.3)
Chloride: 85 mmol/L — ABNORMAL LOW (ref 98–111)
Chloride: 85 mmol/L — ABNORMAL LOW (ref 98–111)
Chloride: 88 mmol/L — ABNORMAL LOW (ref 98–111)
Creatinine, Ser: 5.25 mg/dL — ABNORMAL HIGH (ref 0.61–1.24)
Creatinine, Ser: 5.42 mg/dL — ABNORMAL HIGH (ref 0.61–1.24)
Creatinine, Ser: 5.48 mg/dL — ABNORMAL HIGH (ref 0.61–1.24)
GFR calc Af Amer: 14 mL/min — ABNORMAL LOW (ref >60)
GFR calc Af Amer: 13 mL/min — ABNORMAL LOW (ref >60)
GFR calc Af Amer: 13 mL/min — ABNORMAL LOW (ref >60)
GFR calc non Af Amer: 12 mL/min — ABNORMAL LOW (ref >60)
GFR calc non Af Amer: 12 mL/min — ABNORMAL LOW (ref >60)
GFR calc non Af Amer: 11 mL/min — ABNORMAL LOW (ref >60)
Glucose, Bld: 92 mg/dL (ref 70–99)
Glucose, Bld: 85 mg/dL (ref 70–99)
Glucose, Bld: 83 mg/dL (ref 70–99)
Potassium: 3.3 mmol/L — ABNORMAL LOW (ref 3.5–5.1)
Potassium: 3.2 mmol/L — ABNORMAL LOW (ref 3.5–5.1)
Potassium: 3.2 mmol/L — ABNORMAL LOW (ref 3.5–5.1)
Sodium: 121 mmol/L — ABNORMAL LOW (ref 135–145)
Sodium: 120 mmol/L — ABNORMAL LOW (ref 135–145)
Sodium: 124 mmol/L — ABNORMAL LOW (ref 135–145)

## 2019-09-05 LAB — HEPATIC FUNCTION PANEL
ALT: 33 U/L (ref 0–44)
AST: 122 U/L — ABNORMAL HIGH (ref 15–41)
Albumin: 2 g/dL — ABNORMAL LOW (ref 3.5–5.0)
Alkaline Phosphatase: 205 U/L — ABNORMAL HIGH (ref 38–126)
Bilirubin, Direct: >30 mg/dL — ABNORMAL HIGH (ref 0.0–0.2)
Total Bilirubin: 48.4 mg/dL (ref 0.3–1.2)
Total Protein: 6.5 g/dL (ref 6.5–8.1)

## 2019-09-05 LAB — CBC WITH DIFFERENTIAL/PLATELET
Abs Immature Granulocytes: 1.05 10*3/uL — ABNORMAL HIGH (ref 0.00–0.07)
Basophils Absolute: 0.1 10*3/uL (ref 0.0–0.1)
Basophils Relative: 0 %
Eosinophils Absolute: 0.1 10*3/uL (ref 0.0–0.5)
Eosinophils Relative: 0 %
HCT: 19.7 % — ABNORMAL LOW (ref 39.0–52.0)
Hemoglobin: 6.4 g/dL — CL (ref 13.0–17.0)
Immature Granulocytes: 4 %
Lymphocytes Relative: 3 %
Lymphs Abs: 0.9 10*3/uL (ref 0.7–4.0)
MCH: 26 pg (ref 26.0–34.0)
MCHC: 32.5 g/dL (ref 30.0–36.0)
MCV: 80.1 fL (ref 80.0–100.0)
Monocytes Absolute: 2 10*3/uL — ABNORMAL HIGH (ref 0.1–1.0)
Monocytes Relative: 8 %
Neutro Abs: 22.1 10*3/uL — ABNORMAL HIGH (ref 1.7–7.7)
Neutrophils Relative %: 85 %
Platelets: 221 10*3/uL (ref 150–400)
RBC: 2.46 MIL/uL — ABNORMAL LOW (ref 4.22–5.81)
RDW: 19.9 % — ABNORMAL HIGH (ref 11.5–15.5)
WBC: 26.1 10*3/uL — ABNORMAL HIGH (ref 4.0–10.5)
nRBC: 18.8 % — ABNORMAL HIGH (ref 0.0–0.2)

## 2019-09-05 LAB — CBC
HCT: 21.3 % — ABNORMAL LOW (ref 39.0–52.0)
Hemoglobin: 7 g/dL — ABNORMAL LOW (ref 13.0–17.0)
MCH: 26 pg (ref 26.0–34.0)
MCHC: 32.9 g/dL (ref 30.0–36.0)
MCV: 79.2 fL — ABNORMAL LOW (ref 80.0–100.0)
Platelets: 197 10*3/uL (ref 150–400)
RBC: 2.69 MIL/uL — ABNORMAL LOW (ref 4.22–5.81)
RDW: 18.6 % — ABNORMAL HIGH (ref 11.5–15.5)
WBC: 24.1 10*3/uL — ABNORMAL HIGH (ref 4.0–10.5)
nRBC: 13.1 % — ABNORMAL HIGH (ref 0.0–0.2)

## 2019-09-05 LAB — PROTIME-INR
INR: 1.6 — ABNORMAL HIGH (ref 0.8–1.2)
Prothrombin Time: 18.9 seconds — ABNORMAL HIGH (ref 11.4–15.2)

## 2019-09-05 LAB — ACETAMINOPHEN LEVEL: Acetaminophen (Tylenol), Serum: <10 ug/mL — ABNORMAL LOW (ref 10–30)

## 2019-09-05 LAB — HIV ANTIBODY (ROUTINE TESTING W REFLEX): HIV Screen 4th Generation wRfx: NONREACTIVE

## 2019-09-05 LAB — SARS CORONAVIRUS 2 (TAT 6-24 HRS): SARS Coronavirus 2: NEGATIVE

## 2019-09-05 LAB — PROTEIN / CREATININE RATIO, URINE
Creatinine, Urine: 195.04 mg/dL
Protein Creatinine Ratio: 0.94 mg/mg{Cre} — ABNORMAL HIGH (ref 0.00–0.15)
Total Protein, Urine: 183 mg/dL

## 2019-09-05 LAB — PREPARE RBC (CROSSMATCH)

## 2019-09-05 LAB — OSMOLALITY: Osmolality: 258 mOsm/kg — ABNORMAL LOW (ref 275–295)

## 2019-09-05 LAB — OSMOLALITY, URINE: Osmolality, Ur: 284 mOsm/kg — ABNORMAL LOW (ref 300–900)

## 2019-09-05 LAB — SODIUM, URINE, RANDOM: Sodium, Ur: 13 mmol/L

## 2019-09-05 LAB — MRSA PCR SCREENING: MRSA by PCR: NEGATIVE

## 2019-09-05 MED ORDER — FOLIC ACID 1 MG PO TABS
1.0000 mg | ORAL_TABLET | Freq: Every day | ORAL | Status: DC
  Administered 2019-09-06: 13:00:00 1 mg via ORAL
  Filled 2019-09-05: qty 1

## 2019-09-05 MED ORDER — LIDOCAINE HCL 1 % IJ SOLN
INTRAMUSCULAR | Status: AC
  Filled 2019-09-05: qty 20

## 2019-09-05 MED ORDER — LORAZEPAM 2 MG/ML IJ SOLN
1.0000 mg | INTRAMUSCULAR | Status: DC | PRN
  Administered 2019-09-05: 2 mg via INTRAVENOUS
  Filled 2019-09-05: qty 1

## 2019-09-05 MED ORDER — ONDANSETRON HCL 4 MG/2ML IJ SOLN: 4.0000 mg | Freq: Four times a day (QID) | INTRAMUSCULAR | Status: DC | PRN

## 2019-09-05 MED ORDER — SODIUM CHLORIDE 0.9 % IV BOLUS
1000.0000 mL | Freq: Once | INTRAVENOUS | Status: AC
  Administered 2019-09-05: 1000 mL via INTRAVENOUS

## 2019-09-05 MED ORDER — CHLORHEXIDINE GLUCONATE CLOTH 2 % EX PADS
6.0000 | MEDICATED_PAD | Freq: Every day | CUTANEOUS | Status: DC
  Administered 2019-09-05: 17:00:00 6 via TOPICAL

## 2019-09-05 MED ORDER — ONDANSETRON HCL 4 MG PO TABS: 4.0000 mg | ORAL_TABLET | Freq: Four times a day (QID) | ORAL | Status: DC | PRN

## 2019-09-05 MED ORDER — THIAMINE HCL 100 MG PO TABS: 100.0000 mg | ORAL_TABLET | Freq: Every day | ORAL | Status: DC

## 2019-09-05 MED ORDER — SODIUM CHLORIDE 0.9% IV SOLUTION: Freq: Once | INTRAVENOUS | Status: DC

## 2019-09-05 MED ORDER — THIAMINE HCL 100 MG/ML IJ SOLN
100.0000 mg | Freq: Every day | INTRAMUSCULAR | Status: DC
  Administered 2019-09-05 – 2019-09-06 (×2): 100 mg via INTRAVENOUS
  Filled 2019-09-05 (×2): qty 2

## 2019-09-05 MED ORDER — LORAZEPAM 2 MG/ML IJ SOLN: 0.0000 mg | Freq: Four times a day (QID) | INTRAMUSCULAR | Status: DC

## 2019-09-05 MED ORDER — ADULT MULTIVITAMIN W/MINERALS CH
1.0000 | ORAL_TABLET | Freq: Every day | ORAL | Status: DC
  Administered 2019-09-06: 13:00:00 1 via ORAL
  Filled 2019-09-05: qty 1

## 2019-09-05 MED ORDER — LORAZEPAM 2 MG/ML IJ SOLN: 0.0000 mg | Freq: Two times a day (BID) | INTRAMUSCULAR | Status: DC

## 2019-09-05 MED ORDER — LORAZEPAM 1 MG PO TABS: 1.0000 mg | ORAL_TABLET | ORAL | Status: DC | PRN

## 2019-09-05 MED ORDER — LACTULOSE 10 GM/15ML PO SOLN
30.0000 g | Freq: Four times a day (QID) | ORAL | Status: DC
  Administered 2019-09-05 – 2019-09-06 (×4): 30 g
  Filled 2019-09-05 (×4): qty 45

## 2019-09-05 MED ORDER — DEXMEDETOMIDINE HCL IN NACL 400 MCG/100ML IV SOLN
0.4000 ug/kg/h | INTRAVENOUS | Status: DC
  Administered 2019-09-05: 18:00:00 0.6 ug/kg/h via INTRAVENOUS
  Administered 2019-09-06 (×3): 1.2 ug/kg/h via INTRAVENOUS
  Filled 2019-09-05 (×6): qty 100

## 2019-09-05 MED ORDER — SODIUM CHLORIDE 0.9 % IV SOLN
2.0000 g | INTRAVENOUS | Status: DC
  Administered 2019-09-05: 15:00:00 2 g via INTRAVENOUS
  Filled 2019-09-05 (×2): qty 20

## 2019-09-05 MED ORDER — PANTOPRAZOLE SODIUM 40 MG IV SOLR
40.0000 mg | Freq: Two times a day (BID) | INTRAVENOUS | Status: DC
  Administered 2019-09-05 – 2019-09-06 (×3): 40 mg via INTRAVENOUS
  Filled 2019-09-05 (×3): qty 40

## 2019-09-05 NOTE — Progress Notes (Signed)
eLink Physician-Brief Progress Note Patient Name: Arthur [name] DOB: 1970-05-31 MRN: [id number]   Date of Service  09/05/2019  HPI/Events of Note  Frequent loose stools d/t lactulose Rx - Request for Flexiseal.   eICU Interventions  Will order: 1. Place Flexiseal.      Intervention Category Major Interventions: Other:  Lenell Antu 09/05/2019, 7:34 PM

## 2019-09-05 NOTE — Progress Notes (Signed)
Attempted to call patient's sister x2 - unable to leave voice message. Will try again later.

## 2019-09-05 NOTE — Progress Notes (Signed)
Updated [facility], listed as patient's sister. Upon clarification, she is not a blood relative, but an "adoptive sister" as her family took in Arthur [name] approximately 15 years ago when he was homeless and took care of him. She states his closest blood relative is his father, but she is unsure of his name or number. Arthur [name] states patient's father should be making decisions on behalf of patient. She states best way to find out his number is to look in patient's phone, but it is locked. She will bring it tonight in effort to find father's contact. She is adamant that patient's father should be making code status changes.   Additionally, she was updated clinical condition and poor prognosis. Arthur [name] expressed understanding.

## 2019-09-05 NOTE — Consult Note (Signed)
NAME:  Arthur [name], MRN:  [id number], DOB:  1970-09-20, LOS: 1 ADMISSION DATE:  08/27/2019, CONSULTATION DATE:  09/05/2019 REFERRING MD:  Dr. Maylene Roes, CHIEF COMPLAINT:  Jaundice   Brief History   Arthur [name] is a 49 year old male with a medical history of alcohol use disorder, depression, schizophrenia, and OUD who presented to the ED esterday with several complaints including generalized weakness, N/V, and jaundice. Was found to be in decompensated cirrhosis, acute renal failure, severe hyponatremia and AoC anemia.   History of present illness   Arthur [name] is a 49 year old male with a medical history of alcohol use disorder, depression, schizophrenia, and OUD who presented to the ED yesterday with several complaints including generalized weakness, N/V, and jaundice. He is unable to provide history at this time and HPI obtained from the chart which is also limited. Patient went to PCP about 2 weeks ago and was told he was in acute liver failure and advised to go to the ED immediately. However, he presented today. He was brought in by EMS from a friends house. Family not present in the room.   ED course: He was afebrile, tachycardic, normotensive and oxygenating 93% in room air. Blood work remarkable WBC 26.6, Hgb 6.0, plt 262, Na 121, K 2.5, bicarb 19, Cr 5, AST/ALT 144/40, Alk Phos 241, T bili > 50, INR 1.5, ammonia 91, lipase 28. Acute hepatitis panel was negative. EtOH level unable to obtain due to unidentified substance. EKG showed sinus tachycardia withotu signs of acute ischemia. US abdomen concerning for possible cholecystitis.   Past Medical History  AUD OUD Depression  Schizophrenia  Thalassemia?   Significant Hospital Events   4/12 Admission  4/13 Transfer to the ICU   Consults:  GI, nephrology   Procedures:  None   Significant Diagnostic Tests:  4/13 US abdomen > IMPRESSION: 1. Enlarged echogenic liver consistent with steatosis and or hepatocellular disease. Splenomegaly.  2. Gallbladder wall thickening and small amount of pericholecystic fluid but no shadowing stones or evidence for sonographic Murphy. Findings are indeterminate for cholecystitis. Further evaluation with nuclear medicine hepatobiliary imaging could be evaluate for acute gallbladder disease. 3. Common bile duct slightly enlarged.  Correlate with LFTs.  4/13 CT abdomen/pelvis > IMPRESSION: 1. Hepatomegaly with profound hypodensity of hepatic parenchyma, consistent with severe hepatic steatosis and or acute hepatitis. 2.  Splenomegaly. 3.  Small volume ascites. 4.  Aortic Atherosclerosis (ICD10-I70.0).  Micro Data:  4/12 COVID negative   Antimicrobials:  Ceftriaxone 4/12>   Interim history/subjective:  Patient is sleeping and snoring. Somewhat difficult to arouse. Nonsensical speech.   Objective   Blood pressure 128/78, pulse (!) 105, temperature 98.7 F (37.1 C), temperature source Rectal, resp. rate 17, height _0  (1.778 m), weight 68 kg, SpO2 94 %.        Intake/Output Summary (Last 24 hours) at 09/05/2019 1429 Last data filed at 09/05/2019 1043 Gross per 24 hour  Intake 565 ml  Output -  Net 565 ml   Filed Weights   09/10/2019 1847  Weight: 68 kg    Examination: General: disheveled, sleeping in bed, snoring, in no acute distress  HENT: dry MM, OP clear, scleral icterus  Lungs: CTAB, no increased WOB on room air  Cardiovascular: tachycardic, nl S!/S2, no mrg  Abdomen: distended, hypoactive bowel sounds, HSM present  Extremities: warm and well perfused, no edema  Neuro: difficult to arouse, wakes up briefly, follows some commands, asterixis on exam, follows   [facility]  Problem list     Assessment & Plan:   # Decompensated liver cirrhosis, 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  # Hepatic encephalopathy Grade III # Coagulopathy  # Alcoholic hepatitis  # Alcohol use disorder  - Admit to ICU - GI consulted, will follow up recommendations  - Unclear etiology of  insult: ?cholecystitis ([place] abd with gallbladder wall thickening and pericholecystic fluid, high alk Phos and Tbili), infection (leukocytosis but afebrile), alcohol withdrawal, medication non-adherence, etc  - Will order Bcx though he is already on antibiotic for SBP ppx - Acute hepatitis panel was negative, Acetaminophen < 10  - Follow up ANA, anti-smooth muscle Ab, ceruloplasmin, and IgG  - Continue Ceftriaxone   - Continue MVI, folate, and thiamine   - NGT to start lactulose 30g qid with goal of 3-4 BMs per day - Consider prednisolone, has high Maddrey's Discriminant Function   - CIWA protocol with Ativan ordered, consider Precedex gtt  - No indication for intubation at this time, but will closely monitor respiratory and mental status   # Acute renal failure with oliguria  # Hypokalemia  - Nephrology following, will follow recommendations  - High suspicion for hepatorenal syndrome in the setting of low urine sodium and patient's volume status. Also possible ATN and pigment-induced nephropathy  - No UOP recorded in chart  - Continue trial of albumin, GI recommending no octreotide  - Nephrology recommending palliative consult vs renal replacement therapy  - Strict I/Os and daily weights  - Continue to monitor electrolytes and replete carefully due to ARF   # Acute on chronic anemia  - Has history of thalassemia with baseline Hgb 9-10 - Presented with Hgb 6 s/p 1 unit of pRBCs.  - Repeat CBC with Hgb 6.4, will transfuse 1 more unit and follow up CBC  - IV PPI  - No obvious source of bleeding, FOBT negative. Could be secondary to renal failure, chronic illness, and alcohol use   - No history of varices in chart, no prior EGDs   - Daily CBCs   # Hyponatremia secondary to SIADH - Fluid restriction < 1.5L , currently NPO due to altered mental status  - Continue albumin as above  - Trend BMPs    ** Arthur [name] [phone]) is main caregiver but not POA. Sister Arthur [name]  [phone]) is estranged from patient but only blood-relative available at this time. Patient's father is alive but we do not have his contact information **   Best practice:  Diet: NPO  Pain/Anxiety/Delirium protocol (if indicated): N/A, will start precedex if signs of withdrawal  VAP protocol (if indicated): not intubated  DVT prophylaxis: SCDs  GI prophylaxis: PPI Glucose control: SSI  Mobility: bed rest  Code Status: Full code pending conversation with family  Family Communication: family updated over the phone  Disposition: ICU   Labs   CBC: Recent Labs  Lab 09/09/2019 1954 09/05/19 0625  WBC 26.6* 26.1*  NEUTROABS  --  22.1*  HGB 6.0* 6.4*  HCT 19.3* 19.7*  MCV 78.1* 80.1  PLT 262 001    Basic Metabolic Panel: Recent Labs  Lab 09/07/2019 1954 09/20/2019 2151 09/05/19 0625 09/05/19 1007  NA 121*  --  121* 120*  K 2.5*  --  3.3* 3.2*  CL 82*  --  85* 85*  CO2 19*  --  19* 19*  GLUCOSE 96  --  92 85  BUN 25*  --  29* 30*  CREATININE 5.02*  --  5.25* 5.42*  CALCIUM 8.5*  --  8.4* 8.2*  MG  --  1.6*  --   --    GFR: Estimated Creatinine Clearance: 16.2 mL/min (A) (by C-G formula based on SCr of 5.42 mg/dL (H)). Recent Labs  Lab 09/18/2019 1954 09/05/19 0625  WBC 26.6* 26.1*    Liver Function Tests: Recent Labs  Lab 08/30/2019 1954 09/05/19 1007  AST 144* 122*  ALT 40 33  ALKPHOS 241* 205*  BILITOT >50.0* 48.4*  PROT 7.2 6.5  ALBUMIN 1.9* 2.0*   Recent Labs  Lab 08/25/2019 1954  LIPASE 28   Recent Labs  Lab 08/24/2019 1954  AMMONIA 91*    ABG No results found for: PHART, PCO2ART, PO2ART, HCO3, TCO2, ACIDBASEDEF, O2SAT   Coagulation Profile: Recent Labs  Lab 09/01/2019 1954 09/05/19 0625  INR 1.5* 1.6*    Cardiac Enzymes: No results for input(s): CKTOTAL, CKMB, CKMBINDEX, TROPONINI in the last 168 hours.  HbA1C: Hgb A1c MFr Bld  Date/Time Value Ref Range Status  01/28/2018 06:33 AM 4.4 (L) 4.8 - 5.6 % Final    Comment:    (NOTE) Pre  diabetes:          5.7%-6.4% Diabetes:              >6.4% Glycemic control for   <7.0% adults with diabetes     CBG: No results for input(s): GLUCAP in the last 168 hours.  Review of Systems:   Unable to provide due to altered mentation.   Past Medical History  He,  has a past medical history of ETOH abuse, Heart murmur, Hypertension, Panic attack, and Thalassemia.   Surgical History   History reviewed. No pertinent surgical history.   Social History   reports that he has been smoking. He has been smoking about 1.00 pack per day. He has never used smokeless tobacco. He reports current alcohol use of about 25.0 standard drinks of alcohol per week. He reports current drug use. Drug: Marijuana.   Family History   His family history includes Cancer in his mother; Mental illness in his father and mother.   Allergies Allergies  Allergen Reactions  . Haldol [Haloperidol Lactate] Other (See Comments)    "LOCK JAW"  . Risperidone And Related Other (See Comments)    Prefers to not take -- "makes you grow breasts," per the patient     Home Medications  Prior to Admission medications   Medication Sig Start Date End Date Taking? Authorizing Provider  busPIRone (BUSPAR) 30 MG tablet Take 30 mg by mouth 2 (two) times daily.   Yes [provider]  Ensure (ENSURE) Take 237 mLs by mouth daily as needed (low appetite).   Yes [provider]  folic acid (FOLVITE) 093 MCG tablet Take 800 mcg by mouth 2 (two) times daily.   Yes [provider]  loratadine (CLARITIN) 10 MG tablet Take 10 mg by mouth daily.   Yes [provider]  modafinil (PROVIGIL) 200 MG tablet Take 200 mg by mouth daily.   Yes [provider]  montelukast (SINGULAIR) 10 MG tablet Take 10 mg by mouth at bedtime.   Yes [provider]  Multiple Vitamins-Minerals (MULTIVITAMIN WITH MINERALS) tablet Take 1 tablet by mouth daily.   Yes [provider]  omeprazole  (PRILOSEC) 20 MG capsule Take 20 mg by mouth daily.   Yes [provider]  prazosin (MINIPRESS) 1 MG capsule Take 1 mg by mouth 3 (three) times daily.   Yes [provider]  QUEtiapine (SEROQUEL) 400 MG tablet Take 800 mg by mouth at bedtime.    Yes [provider]  senna (SENOKOT) 8.6 MG TABS tablet Take 2 tablets by mouth daily.   Yes [provider]

## 2019-09-05 NOTE — ED Notes (Signed)
GI NP at bedside. 

## 2019-09-05 NOTE — Progress Notes (Addendum)
  PROGRESS NOTE  Patient admitted earlier this morning. See H&P.   Arthur [name] is a 49 y.o. male with history of alcohol abuse who presents to the ER with complaint of having increasing jaundice over the last 2 weeks. Admits to drinking vodka every day, last drink was yesterday.  Patient denies taking any Tylenol. In the ER, patient appears jaundiced with labs showing creatinine of 5, potassium 2.5, sodium 121, albumin 1.9, AST 144, ammonia 91, total bilirubin >50, WBC 26.6, hemoglobin 6, platelets 262, INR 1.5. Covid test was negative. Acute hepatitis panel negative. EKG showing sinus tachycardia. Patient admitted for acute renal failure likely hepatorenal syndrome with alcoholic hepatitis with worsening anemia. US abdomen showed enlarged echogenic liver consistent with steatosis and or hepatocellular disease. Splenomegaly.  On exam, patient is jaundiced, confused. He is arousable to voice but incoherently mumbling and lethargic. He is tachycardic. Abdomen is distended but does not appear to be TTP.   -Nephrology and GI consulted for concern for AKI, HRS, alcoholic liver disease -Paracentesis unable to perform due to trace amount fluid  -MRCP pending  -Received 1 u pRBC  -Continue rocephin for SBP coverage  -Trend labs closely  -CIWA protocol  -IV PPI  -Mg and K replaced   Patient currently lethargic but arousable and protecting airway. Currently awaiting progressive bed assignment. Will continue to check in throughout the day and if decompensates, may need ICU transfer. Will monitor.   ===  Addendum: Checked in again on patient this afternoon. He remains incoherent, GCS 12, CIWA 15 this morning.  -PCCM consulted  -Palliative care consulted  -Could not get ahold of emergency contact listed in record   Noralee Stain, DO [facility] 09/05/2019, 1:51 PM   Available via Epic secure chat 7am-7pm After these hours, please refer to coverage provider listed on [email or web address]

## 2019-09-05 NOTE — ED Notes (Signed)
RN bladder scanned pt and saw 191 mL. RN still unable to collect urine and pt unable to urinal. Dr. Toniann Fail notified and ordered in/out cath.

## 2019-09-05 NOTE — Progress Notes (Signed)
Patient seen at bedside for  paracentesis therapeutic and diagnostic . [place] limited abdomen shows trace amount of peritoneal  fluid noted  Insufficient to perform a safe paracentesis. Procedure not performed.

## 2019-09-05 NOTE — ED Notes (Signed)
Arthur [name]-- care giver-- has permission to receive information -from pt-- also [facility]-- "sister" is at bedside--  Arthur [name] -- [phone] Arthur [name] [phone]  PLEASE NOTIFY OF ANY CHANGES etc

## 2019-09-05 NOTE — ED Notes (Signed)
Pt incontinent of urine and stool, RN attempted to place male external catheter, but pt pulled it off. RN unable to collect urine sample at this time. RN bladder scanned pt, scan showed 101 mL.

## 2019-09-05 NOTE — Progress Notes (Signed)
Verbal order via Posey Boyer, NP to advance NG by 7 cm

## 2019-09-05 NOTE — H&P (Signed)
History and Physical    Arthur [name] BZJ:[id number] DOB: December 21, 1970 DOA: 2019-09-29  PCP: Gaylord Shih, MD (Inactive)  Patient coming from: Home.  Chief Complaint: Jaundice.  HPI: Arthur [name] is a 49 y.o. male with history of alcohol abuse presents to the ER with complaint of having increasing jaundice over the last 2 weeks.  Denies any nausea vomiting abdominal pain or diarrhea.  Denies any fever chills.  Admits to drinking vodka every day last drink was yesterday.  Patient denies taking any Tylenol.  ED Course: In the ER on exam patient appears jaundiced with labs showing creatinine of 5 potassium 2.5 sodium 121 albumin 1.9 AST 144 ammonia 91 total bilirubin 50 WBC 26.6 hemoglobin 6 platelets 262 INR 1.5 Covid test was negative acute hepatitis panel negative EKG showing sinus tachycardia.  On exam patient appears mildly confused but otherwise following commands.  States he is here because he is sick.  Abdomen appears distended likely has ascites but nontender.  Patient's hemoglobin is around 6.  When compared to patient's labs in 2019 patient has significant acute renal failure worsening anemia with leukocytosis.  Patient admitted for acute renal failure likely hepatorenal syndrome with alcoholic hepatitis with worsening anemia.  Review of Systems: As per HPI, rest all negative.   Past Medical History:  Diagnosis Date  . ETOH abuse   . Heart murmur   . Hypertension   . Panic attack   . Thalassemia     History reviewed. No pertinent surgical history.   reports that he has been smoking. He has been smoking about 1.00 pack per day. He has never used smokeless tobacco. He reports current alcohol use of about 25.0 standard drinks of alcohol per week. He reports current drug use. Drug: Marijuana.  Allergies  Allergen Reactions  . Haldol [Haloperidol Lactate] Other (See Comments)    "LOCK JAW"  . Risperidone And Related Other (See Comments)    Prefers to not take -- "makes  you grow breasts," per the patient    Family History  Problem Relation Age of Onset  . Cancer Mother   . Mental illness Mother   . Mental illness Father     Prior to Admission medications   Medication Sig Start Date End Date Taking? Authorizing Provider  QUEtiapine (SEROQUEL) 400 MG tablet Take 400 mg by mouth at bedtime.   Yes [provider]    Physical Exam: Constitutional: Moderately built and nourished. Vitals:   09/05/19 0100 09/05/19 0130 09/05/19 0145 09/05/19 0145  BP: 116/78 (!) 120/91 110/79 (!) 120/91  Pulse: (!) 106   (!) 107  Resp: (!) 21 19 18 18   Temp:    97.6 F (36.4 C)  TempSrc:    Oral  SpO2: 94%   98%  Weight:      Height:       Eyes: Icterus present. ENMT: No discharge from the ears eyes nose or mouth. Neck: No mass felt.  No neck rigidity. Respiratory: No rhonchi or crepitations. Cardiovascular: S1-S2 heard. Abdomen: Soft distended nontender bowel sounds present. Musculoskeletal: No edema. Skin: No rash. Neurologic: Alert awake oriented to person and place moves all extremities. Psychiatric: Oriented to place and person.   Labs on Admission: I have personally reviewed following labs and imaging studies  CBC: Recent Labs  Lab 29-Sep-2019 1954  WBC 26.6*  HGB 6.0*  HCT 19.3*  MCV 78.1*  PLT 262   Basic Metabolic Panel: Recent Labs  Lab 29-Sep-2019 1954 09/29/19 2151  NA 121*  --   K 2.5*  --   CL 82*  --   CO2 19*  --   GLUCOSE 96  --   BUN 25*  --   CREATININE 5.02*  --   CALCIUM 8.5*  --   MG  --  1.6*   GFR: Estimated Creatinine Clearance: 17.5 mL/min (A) (by C-G formula based on SCr of 5.02 mg/dL (H)). Liver Function Tests: Recent Labs  Lab 08/28/2019 1954  AST 144*  ALT 40  ALKPHOS 241*  BILITOT >50.0*  PROT 7.2  ALBUMIN 1.9*   Recent Labs  Lab 09/19/2019 1954  LIPASE 28   Recent Labs  Lab 09/10/2019 1954  AMMONIA 91*   Coagulation Profile: Recent Labs  Lab 09/16/2019 1954  INR 1.5*   Cardiac  Enzymes: No results for input(s): CKTOTAL, CKMB, CKMBINDEX, TROPONINI in the last 168 hours. BNP (last 3 results) No results for input(s): PROBNP in the last 8760 hours. HbA1C: No results for input(s): HGBA1C in the last 72 hours. CBG: No results for input(s): GLUCAP in the last 168 hours. Lipid Profile: No results for input(s): CHOL, HDL, LDLCALC, TRIG, CHOLHDL, LDLDIRECT in the last 72 hours. Thyroid Function Tests: No results for input(s): TSH, T4TOTAL, FREET4, T3FREE, THYROIDAB in the last 72 hours. Anemia Panel: No results for input(s): VITAMINB12, FOLATE, FERRITIN, TIBC, IRON, RETICCTPCT in the last 72 hours. Urine analysis:    Component Value Date/Time   COLORURINE AMBER (A) 01/24/2018 1456   APPEARANCEUR CLEAR 01/24/2018 1456   LABSPEC 1.016 01/24/2018 1456   PHURINE 5.0 01/24/2018 1456   GLUCOSEU NEGATIVE 01/24/2018 1456   HGBUR NEGATIVE 01/24/2018 1456   BILIRUBINUR NEGATIVE 01/24/2018 1456   KETONESUR NEGATIVE 01/24/2018 1456   PROTEINUR NEGATIVE 01/24/2018 1456   NITRITE NEGATIVE 01/24/2018 1456   LEUKOCYTESUR SMALL (A) 01/24/2018 1456   Sepsis Labs: @LABRCNTIP (procalcitonin:4,lacticidven:4) )No results found for this or any previous visit (from the past 240 hour(s)).   Radiological Exams on Admission: No results found.  EKG: Independently reviewed.  Sinus tachycardia.  Assessment/Plan Principal Problem:   Hepatorenal syndrome (HCC) Active Problems:   Alcohol use disorder, severe, dependence (HCC)   Microcytic hypochromic anemia   Jaundice   Ascites   Hypokalemia   Hypomagnesemia   Alcoholic liver disease ([place])    1. Acute renal failure -appreciate nephrology consult.  Likely cause could be hepatorenal syndrome.  Patient is receiving PRBC.  Patient is placed on albumin infusion and sonogram of the abdomen is pending.  Closely follow intake output urine studies and daily metabolic panels. 2. Jaundice likely from alcoholic hepatitis versus worsening  cirrhosis likely related to alcoholism.  Sonogram of the abdomen is pending.  Will need paracentesis to check for the ascites.  Presently I placed patient on empiric antibiotics for SBP coverage. 3. Severe hyponatremia hypokalemia and hypomagnesemia likely from poor nutrition and beer portal mania.  Replace recheck and follow metabolic panel closely.  Urine studies are pending. 4. Severe anemia patient is receiving 2 units of PRBC transfusion.  We will keep patient n.p.o. IV Protonix consult GI. 5. Alcohol abuse for which patient is on CIWA protocol.  Since patient has acute renal failure worsening liver function with ascites and anemia will need close monitoring for any further deterioration in inpatient status.  DVT prophylaxis: Possible GI bleed. Code Status: Full code. Family Communication: Unable to reach family. Disposition Plan: To be determined. Consults called: Nephrology. Admission status: Inpatient.   Rise Patience MD [facility] Pager [phone]  [phone].  If 7PM-7AM, please contact night-coverage [email or web address] Password [email or web address]  09/05/2019, 2:08 AM

## 2019-09-05 NOTE — Progress Notes (Signed)
Patient ID: Arthur [name], male   DOB: 07/09/1970, 49 y.o.   MRN: [id number] [facility] Progress Note   Assessment/ Plan:   1. Acute kidney Injury: Differentials include hepatorenal syndrome with depressed urinary sodium and current volume status for which he is getting a trial of albumin infusions.  Other possible differentials include ATN versus biliary cast nephropathy.  He has been anuric since his presentation to the emergency room which portends an overall poor outlook.  Given the nature of his underlying hepatic injury and ongoing alcohol use, I would be very hesitant to offer renal replacement therapy and would appreciate a palliative care consultation/input. 2.  Hyponatremia: Hypoosmolar hyponatremia with urine osmolality of 280 which indicates inappropriate ADH release.  Continue daily fluid restriction of <1.5 L/day and supportive management with albumin infusion. 3.  Alcoholic hepatitis: With severe hyperbilirubinemia and evidence of hepatic synthetic defect with hypoalbuminemia and elevated INR/coagulopathy.  Continue current supportive management and await GI input. 4.  Anemia: Likely secondary to chronic illness/poor intake versus occult bleed in the setting of alcoholic liver disease.  Status post PRBC transfusion, continue to follow hemoglobin/hematocrit trend along with quantification of iron studies.  Subjective:   Unable to offer clear answers to questions.  Per nurse, was confused and appeared to be trying to get out of bed.   Objective:   BP 128/78   Pulse (!) 105   Temp 98.7 F (37.1 C) (Rectal)   Resp 17   Ht 5\' 10"  (1.778 m)   Wt 68 kg   SpO2 94%   BMI 21.52 kg/m   Intake/Output Summary (Last 24 hours) at 09/05/2019 1249 Last data filed at 09/05/2019 1043 Gross per 24 hour  Intake 565 ml  Output --  Net 565 ml   Weight change:   Physical Exam: Gen: Appears to be comfortable resting in bed, icteric, confused CVS: Pulse regular tachycardia,  S1 and S2 with ejection systolic murmur Resp: Anteriorly clear to auscultation, no rales/rhonchi Abd: Soft, moderately globally distended nontender with mild lower quadrant tenderness. Ext: No edema over lower extremities  Imaging: US Abdomen Complete  Result Date: 09/05/2019 CLINICAL DATA:  Abdomen pain hyperbilirubinemia EXAM: ABDOMEN ULTRASOUND COMPLETE COMPARISON:  None. FINDINGS: Gallbladder: No shadowing stones. Increased wall thickness up to 14 mm with gallbladder wall edema. Trace pericholecystic fluid. Negative sonographic Murphy. Common bile duct: Diameter: Up to 7.7 mm Liver: Liver is echogenic. Liver is enlarged. No focal hepatic abnormality. Portal vein is patent on color Doppler imaging with normal direction of blood flow towards the liver. IVC: No abnormality visualized. Pancreas: Visualized portion unremarkable. Spleen: Enlarged with volume of 352.3 mL. Right Kidney: Length: 12.6 cm. Echogenicity within normal limits. No mass or hydronephrosis visualized. Left Kidney: Length: 12.6 cm. Echogenicity within normal limits. No mass or hydronephrosis visualized. Abdominal aorta: No aneurysm visualized. Distal aorta and bifurcation are obscured by bowel gas. Maximum diameter of 2.6 cm. Other findings: None. IMPRESSION: 1. Enlarged echogenic liver consistent with steatosis and or hepatocellular disease. Splenomegaly. 2. Gallbladder wall thickening and small amount of pericholecystic fluid but no shadowing stones or evidence for sonographic Murphy. Findings are indeterminate for cholecystitis. Further evaluation with nuclear medicine hepatobiliary imaging could be evaluate for acute gallbladder disease. 3. Common bile duct slightly enlarged.  Correlate with LFTs. Electronically Signed   By: Donavan Foil M.D.   On: 09/05/2019 03:43    Labs: BMET Recent Labs  Lab 09/09/19 1954 09/05/19 0625 09/05/19 1007  NA 121* 121* 120*  K  2.5* 3.3* 3.2*  CL 82* 85* 85*  CO2 19* 19* 19*  GLUCOSE 96 92  85  BUN 25* 29* 30*  CREATININE 5.02* 5.25* 5.42*  CALCIUM 8.5* 8.4* 8.2*   CBC Recent Labs  Lab 09/22/2019 1954 09/05/19 0625  WBC 26.6* 26.1*  NEUTROABS  --  22.1*  HGB 6.0* 6.4*  HCT 19.3* 19.7*  MCV 78.1* 80.1  PLT 262 221   Medications:    . folic acid  1 mg Oral Daily  . LORazepam  0-4 mg Intravenous Q6H   Followed by  . [START ON 09/07/2019] LORazepam  0-4 mg Intravenous Q12H  . multivitamin with minerals  1 tablet Oral Daily  . pantoprazole (PROTONIX) IV  40 mg Intravenous Q12H  . thiamine  100 mg Oral Daily   Or  . thiamine  100 mg Intravenous Daily   Zetta Bills, MD 09/05/2019, 12:49 PM

## 2019-09-05 NOTE — ED Notes (Signed)
Pt removed cardiac monitoring. RN replaced monitor, cleaned pt, and changed pt linens.

## 2019-09-05 NOTE — ED Notes (Signed)
Admitting dr at bedside.  

## 2019-09-05 NOTE — Consult Note (Signed)
Referring Provider: Dr. Dessa Phi  Primary Care Physician:  Renella Cunas, MD (Inactive) Primary Gastroenterologist:  Althia Forts   Reason for Consultation:  Severe alcoholic hepatitis, anemia, possible hepatorenal syndrome   HPI: Arthur [name] is a 49 y.o. male with a past medical history of thalassemia, hypertension, psychiatric disease on Seroquel and EtOH abuse. He presented to [facility] ER on 09/22/2019 by EMS. He presented with jaundice, severe abdominal distension, nausea and diarrhea.  He was noted to have increased jaundice over the past 2 weeks. History from the ED physician reports patient drinks 1/2 gallon of 90 proof Vodka every 3 days.  Last alcohol consumption was on 09/21/2019. He denied taking Tylenol. Acetaminophen level was not done.  In the ED his labs showed hyponatremia with sodium level 121. Hypokalemia with K+ 2.5. Hyperbilirubinemia with T. Bili > 50. Elevated LFTs. AST/ALT ratio consistent with alcoholic hepatitis. AST 144. ALT 40. Alk phos 241. INR 1.5. AKI with Cr. 5.02. Microcytic anemia. Hg 6.0. HCT 19.3. Leukocytosis with WBC 26.6. Normal PLT count. Acute hepatitis panel was negative. Sars Covid 19 negative. An abdominal sonogram showed an enlarged liver with steatosis vs hepatocellular disease, splenomegaly and gallbladder wall thickening indeterminate for cholecystitis. No gall stones. Common bile duct mildly dilated. Nephrology was consulted with concerns of developing hepatorenal syndrome. Patient was started on Albumin 25gm IV 8 hours. He received Rocephin 1gm IV x 1 for SBP prophylaxis. A diagnostic paracentesis was attempted by IR but an abdominal sono showed an insufficient amount of peritoneal fluid to perform a paracentesis safely. CIWA protocol was initiated. He last received Ativan 53m at 3am today. He has intermittent agitation but he is not combative. He is intermittently arousable. No family at the bed side. Patient is unable to provide any history.   ED  course: Labs 08/26/2019: Sodium 121.  Potassium 2.5.  Chloride 82.  CO2 19.  Glucose 96.  BUN 25.  Creatinine 5.02.  Calcium 8.5.  Anion gap 20.  Alk phos 241.  Albumin 1.9.  Lipase 28.  AST 144.  ALT 40.  Total protein 7.2.  Ammonia 91.  Total bili greater than 50.0.  WBC 26.6.  Hemoglobin 6.0.  Hematocrit 19.3.  MCV 78.1.  Platelet 262.  INR 1.5.  Acute hepatitis panel negative. SARS Coronavirus 2 Negative.   09/05/2019 at 0625:  Sodium 121.  Potassium 3.3.  BUN 29.  Creatinine 5.25.  WBC 26.1.  Hemoglobin 6.4.  Hematocrit 19.7.  Platelet 221.  INR 1.6.  HIV negative.  Urine bilirubin positive.  Urine protein 183.  Urine osmolality 284.  Urine sodium 13.  09/05/2019 at 1007: Sodium 120.  Potassium 3.2.  BUN 30.  Creatinine 5.42.  Alk phos 205.  Albumin 2.0.  AST 122.  ALT 33.  Total bilirubin 48.4.  12 lead EKG: Sinus tachycardia 106 bpm, normal axis, PACs, prolonged prolonged PR interval, prolonged QTC at 471 ms likely secondary to U waves fusing with a T complex secondary to hypokalemia with a potassium of 2.5, PRWP, nonspecific T wave changes in the lateral leads  Abdominal ultrasound 09/05/2019: 1. Enlarged echogenic liver consistent with steatosis and or hepatocellular disease. Splenomegaly. 2. Gallbladder wall thickening and small amount of pericholecystic fluid but no shadowing stones or evidence for sonographic Murphy. Findings are indeterminate for cholecystitis. Further evaluation with nuclear medicine hepatobiliary imaging could be evaluate for acute gallbladder disease. 3. Common bile duct slightly enlarged.  Correlate with LFTs.   Past Medical History:  Diagnosis Date  .  ETOH abuse   . Heart murmur   . Hypertension   . Panic attack   . Thalassemia     History reviewed. No pertinent surgical history.  Prior to Admission medications   Medication Sig Start Date End Date Taking? Authorizing Provider  busPIRone (BUSPAR) 30 MG tablet Take 30 mg by mouth 2 (two) times daily.   Yes  [provider]  Ensure (ENSURE) Take 237 mLs by mouth daily as needed (low appetite).   Yes [provider]  folic acid (FOLVITE) 449 MCG tablet Take 800 mcg by mouth 2 (two) times daily.   Yes [provider]  loratadine (CLARITIN) 10 MG tablet Take 10 mg by mouth daily.   Yes [provider]  modafinil (PROVIGIL) 200 MG tablet Take 200 mg by mouth daily.   Yes [provider]  montelukast (SINGULAIR) 10 MG tablet Take 10 mg by mouth at bedtime.   Yes [provider]  Multiple Vitamins-Minerals (MULTIVITAMIN WITH MINERALS) tablet Take 1 tablet by mouth daily.   Yes [provider]  omeprazole (PRILOSEC) 20 MG capsule Take 20 mg by mouth daily.   Yes [provider]  prazosin (MINIPRESS) 1 MG capsule Take 1 mg by mouth 3 (three) times daily.   Yes [provider]  QUEtiapine (SEROQUEL) 400 MG tablet Take 800 mg by mouth at bedtime.    Yes [provider]  senna (SENOKOT) 8.6 MG TABS tablet Take 2 tablets by mouth daily.   Yes [provider]    Current Facility-Administered Medications  Medication Dose Route Frequency Provider Last Rate Last Admin  . albumin human 5 % solution 25 g  25 g Intravenous TID Rise Patience, MD 60 mL/hr at 09/05/19 1045 25 g at 09/05/19 1045  . cefTRIAXone (ROCEPHIN) 2 g in sodium chloride 0.9 % 100 mL IVPB  2 g Intravenous Q24H Rise Patience, MD      . folic acid (FOLVITE) tablet 1 mg  1 mg Oral Daily Rise Patience, MD      . lidocaine (XYLOCAINE) 1 % (with pres) injection           . LORazepam (ATIVAN) injection 0-4 mg  0-4 mg Intravenous Q6H Rise Patience, MD       Followed by  . [START ON 09/07/2019] LORazepam (ATIVAN) injection 0-4 mg  0-4 mg Intravenous Q12H Rise Patience, MD      . LORazepam (ATIVAN) tablet 1-4 mg  1-4 mg Oral Q1H PRN Rise Patience, MD       Or  . LORazepam (ATIVAN) injection 1-4 mg  1-4 mg Intravenous  Q1H PRN Rise Patience, MD   2 mg at 09/05/19 0254  . multivitamin with minerals tablet 1 tablet  1 tablet Oral Daily Rise Patience, MD      . ondansetron [facility]) tablet 4 mg  4 mg Oral Q6H PRN Rise Patience, MD       Or  . ondansetron [facility]) injection 4 mg  4 mg Intravenous Q6H PRN Rise Patience, MD      . pantoprazole (PROTONIX) injection 40 mg  40 mg Intravenous Q12H Rise Patience, MD      . thiamine tablet 100 mg  100 mg Oral Daily Rise Patience, MD       Or  . thiamine (B-1) injection 100 mg  100 mg Intravenous Daily Rise Patience, MD       Current Outpatient Medications  Medication Sig Dispense Refill  . busPIRone (BUSPAR) 30 MG tablet Take 30 mg by mouth 2 (two) times daily.    . Ensure (ENSURE) Take 237 mLs by mouth daily as needed (low appetite).    . folic acid (FOLVITE) 638 MCG tablet Take 800 mcg by mouth 2 (two) times daily.    Marland Kitchen loratadine (CLARITIN) 10 MG tablet Take 10 mg by mouth daily.    . modafinil (PROVIGIL) 200 MG tablet Take 200 mg by mouth daily.    . montelukast (SINGULAIR) 10 MG tablet Take 10 mg by mouth at bedtime.    . Multiple Vitamins-Minerals (MULTIVITAMIN WITH MINERALS) tablet Take 1 tablet by mouth daily.    Marland Kitchen omeprazole (PRILOSEC) 20 MG capsule Take 20 mg by mouth daily.    . prazosin (MINIPRESS) 1 MG capsule Take 1 mg by mouth 3 (three) times daily.    . QUEtiapine (SEROQUEL) 400 MG tablet Take 800 mg by mouth at bedtime.     . senna (SENOKOT) 8.6 MG TABS tablet Take 2 tablets by mouth daily.      Allergies as of 08/25/2019 - Review Complete 08/25/2019  Allergen Reaction Noted  . Haldol [haloperidol lactate] Other (See Comments) 03/15/2014  . Risperidone and related Other (See Comments) 08/08/2014    Family History  Problem Relation Age of Onset  . Cancer Mother   . Mental illness Mother   . Mental illness Father     Social History   Socioeconomic History  . Marital status: Single     Spouse name: Not on file  . Number of children: Not on file  . Years of education: Not on file  . Highest education level: Not on file  Occupational History  . Not on file  Tobacco Use  . Smoking status: Current Every Day Smoker    Packs/day: 1.00  . Smokeless tobacco: Never Used  Substance and Sexual Activity  . Alcohol use: Yes    Alcohol/week: 25.0 standard drinks    Types: 5 Cans of beer, 20 Shots of liquor per week  . Drug use: Yes    Types: Marijuana  . Sexual activity: Yes  Other Topics Concern  . Not on file  Social History Narrative  . Not on file   Social Determinants of Health   Financial Resource Strain:   . Difficulty of Paying Living Expenses:   Food Insecurity:   . Worried About Charity fundraiser in the Last Year:   . Arboriculturist in the Last Year:   Transportation Needs:   . Film/video editor (Medical):   Marland Kitchen Lack of Transportation (Non-Medical):   Physical Activity:   . Days of Exercise per Week:   . Minutes of Exercise per Session:   Stress:   . Feeling of Stress :   Social Connections:   . Frequency of Communication with Friends and Family:   . Frequency of Social Gatherings with Friends and Family:   . Attends Religious Services:   . Active Member of Clubs or Organizations:   . Attends Archivist Meetings:   Marland Kitchen Marital Status:   Intimate Partner Violence:   . Fear of Current or Ex-Partner:   . Emotionally Abused:   Marland Kitchen Physically Abused:   . Sexually Abused:     Review of Systems: Limited review of systems as patient intermittently answers questions then drifts off.   Physical Exam: Vital signs in last 24 hours: Temp:  [97.5 F (36.4 C)-98.1 F (36.7  C)] 97.6 F (36.4 C) (04/13 0145) Pulse Rate:  [105-111] 105 (04/13 1100) Resp:  [16-22] 18 (04/13 1100) BP: (104-131)/(53-91) 116/83 (04/13 1100) SpO2:  [93 %-100 %] 94 % (04/13 1100) Weight:  [68 kg] 68 kg (04/12 1847)  Current Oxygen saturation 935 on room air.      General: Ill appearing 49 year old male severely jaundiced Head:  Normocephalic and atraumatic. Eyes:  Scleral icterus present. Conjunctiva pale.  Ears:  Normal auditory acuity. Nose:  No deformity, discharge or lesions. Mouth:  Patient will not open mouth for exam. Neck:  Supple. No lymphadenopathy or thyromegaly.  Lungs:  Clear, diminished in the bases.  Heart:  RRR, no murmur.  Abdomen:  Grossly distended. Firm. Hyoactive bowel sounds. No HSM. Rectal: Stool brown guaiac negative. Santiago Glad RN present during exam.  Musculoskeletal:  Symmetrical without gross deformities.  Pulses:  Normal pulses noted. Extremities:  Without clubbing or edema. Neurologic:  Alert and  oriented x 1. + Asterixis.  Skin: Grossly jaundiced. No skin ulcers or break down.  Psych: Somewhat lethargic. Intermittently agitated.   Intake/Output from previous day: 04/12 0701 - 04/13 0700 In: 315 [Blood:315] Out: -  Intake/Output this shift: Total I/O In: 250 [IV Piggyback:250] Out: -   Lab Results: Recent Labs    09/01/2019 1954 09/05/19 0625  WBC 26.6* 26.1*  HGB 6.0* 6.4*  HCT 19.3* 19.7*  PLT 262 221   BMET Recent Labs    09/07/2019 1954 09/05/19 0625 09/05/19 1007  NA 121* 121* 120*  K 2.5* 3.3* 3.2*  CL 82* 85* 85*  CO2 19* 19* 19*  GLUCOSE 96 92 85  BUN 25* 29* 30*  CREATININE 5.02* 5.25* 5.42*  CALCIUM 8.5* 8.4* 8.2*   LFT Recent Labs    09/05/19 1007  PROT 6.5  ALBUMIN 2.0*  AST 122*  ALT 33  ALKPHOS 205*  BILITOT PENDING  BILIDIR PENDING  IBILI PENDING   PT/INR Recent Labs    09/03/2019 1954 09/05/19 0625  LABPROT 18.4* 18.9*  INR 1.5* 1.6*   Hepatitis Panel Recent Labs    09/12/2019 1954  HEPBSAG NON REACTIVE  HCVAB NON REACTIVE  HEPAIGM NON REACTIVE  HEPBIGM NON REACTIVE      Studies/Results: US Abdomen Complete  Result Date: 09/05/2019 CLINICAL DATA:  Abdomen pain hyperbilirubinemia EXAM: ABDOMEN ULTRASOUND COMPLETE COMPARISON:  None. FINDINGS: Gallbladder:  No shadowing stones. Increased wall thickness up to 14 mm with gallbladder wall edema. Trace pericholecystic fluid. Negative sonographic Murphy. Common bile duct: Diameter: Up to 7.7 mm Liver: Liver is echogenic. Liver is enlarged. No focal hepatic abnormality. Portal vein is patent on color Doppler imaging with normal direction of blood flow towards the liver. IVC: No abnormality visualized. Pancreas: Visualized portion unremarkable. Spleen: Enlarged with volume of 352.3 mL. Right Kidney: Length: 12.6 cm. Echogenicity within normal limits. No mass or hydronephrosis visualized. Left Kidney: Length: 12.6 cm. Echogenicity within normal limits. No mass or hydronephrosis visualized. Abdominal aorta: No aneurysm visualized. Distal aorta and bifurcation are obscured by bowel gas. Maximum diameter of 2.6 cm. Other findings: None. IMPRESSION: 1. Enlarged echogenic liver consistent with steatosis and or hepatocellular disease. Splenomegaly. 2. Gallbladder wall thickening and small amount of pericholecystic fluid but no shadowing stones or evidence for sonographic Murphy. Findings are indeterminate for cholecystitis. Further evaluation with nuclear medicine hepatobiliary imaging could be evaluate for acute gallbladder disease. 3. Common bile duct slightly enlarged.  Correlate with LFTs. Electronically Signed   By: Donavan Foil M.D.   On:  09/05/2019 03:43    IMPRESSION/PLAN:  58.  49 year old male with acute alcoholic hepatitis with chronic liver disease, profound jaundice with T. Bili > 50. Abdominal sonogram showed a dilated CBD, gallbladder wall thickening, cholecystitis could not be excluded.  -Abdominal/pevic CT without contrast to assess the CBD, rule out intra abdominal/pelvic infectious/inflammatmory process.  RN to accompany patient to radiology -MRCP canceled -Agree with Rocephin 1 g every 24 hours -NGT placement.  -KUB to verify NGT placement -Lactulose 30gm per NGT qid titrate to 3 to 4 loose BMs daily,  if not tolerated will require rectal lactulose enema  -CIWA protocol in process -Repeat CBC with diff, hepatic panel, BMP, INR and Ammonia in am. Ceruloplasmin. IgG. SMA. AMA. ANA. -Prednisolone deferred due to leukocytosis  2.  Severe AKI concerning for developing hepatorenal syndrome. 1.5 L fluid restriction per nephrology. Urine Na+ 13. No plans for Octreotide at this time. -Albumin 25 g IV 3 times daily per nephrology  3.  Macrocytic anemia. Admission Hg 6.0 -> 6.4.  Received 1 unit of packed red blood cells. No obvious GI bleeding. Stool guaiac negative.  -Repeat H/H -Transfuse for Hg < 7.  -Protonix 40 mg IV every 24 hours if okay with nephrology  4.  Hypokalemia -KCL replacement per the hospitalist   5.  Leukocytosis. He is afebrile.  -Recommend blood cultures x 2 if not already done  6. Hepatic Encephalopathy. + Asterixis on exam.  -Recommend ICU step down vs ICU admission, patient nearly obtunded. -Lactulose 30gm QID  per NGT, if not tolerated he will need Lactulose enemas   Further recommendations per Dr. Corena Pilgrim Dorathy Daft  09/05/2019, 11:22 AM

## 2019-09-06 DIAGNOSIS — K767 Hepatorenal syndrome: Principal | ICD-10-CM

## 2019-09-06 DIAGNOSIS — K7031 Alcoholic cirrhosis of liver with ascites: Secondary | ICD-10-CM

## 2019-09-06 DIAGNOSIS — R601 Generalized edema: Secondary | ICD-10-CM | POA: Diagnosis not present

## 2019-09-06 DIAGNOSIS — K709 Alcoholic liver disease, unspecified: Secondary | ICD-10-CM | POA: Diagnosis not present

## 2019-09-06 DIAGNOSIS — K7290 Hepatic failure, unspecified without coma: Secondary | ICD-10-CM | POA: Diagnosis not present

## 2019-09-06 LAB — CBC
HCT: 20.6 % — ABNORMAL LOW (ref 39.0–52.0)
Hemoglobin: 6.8 g/dL — CL (ref 13.0–17.0)
MCH: 26.5 pg (ref 26.0–34.0)
MCHC: 33 g/dL (ref 30.0–36.0)
MCV: 80.2 fL (ref 80.0–100.0)
Platelets: 184 10*3/uL (ref 150–400)
RBC: 2.57 MIL/uL — ABNORMAL LOW (ref 4.22–5.81)
RDW: 18.5 % — ABNORMAL HIGH (ref 11.5–15.5)
WBC: 20.8 10*3/uL — ABNORMAL HIGH (ref 4.0–10.5)
nRBC: 14.5 % — ABNORMAL HIGH (ref 0.0–0.2)

## 2019-09-06 LAB — CBC WITH DIFFERENTIAL/PLATELET
Abs Immature Granulocytes: 1.4 10*3/uL — ABNORMAL HIGH (ref 0.00–0.07)
Basophils Absolute: 0.1 10*3/uL (ref 0.0–0.1)
Basophils Relative: 0 %
Eosinophils Absolute: 0.1 10*3/uL (ref 0.0–0.5)
Eosinophils Relative: 1 %
HCT: 24.3 % — ABNORMAL LOW (ref 39.0–52.0)
Hemoglobin: 8.1 g/dL — ABNORMAL LOW (ref 13.0–17.0)
Immature Granulocytes: 6 %
Lymphocytes Relative: 0 %
Lymphs Abs: 0.1 10*3/uL — ABNORMAL LOW (ref 0.7–4.0)
MCH: 26.9 pg (ref 26.0–34.0)
MCHC: 33.3 g/dL (ref 30.0–36.0)
MCV: 80.7 fL (ref 80.0–100.0)
Monocytes Absolute: 1.9 10*3/uL — ABNORMAL HIGH (ref 0.1–1.0)
Monocytes Relative: 8 %
Neutro Abs: 19.9 10*3/uL — ABNORMAL HIGH (ref 1.7–7.7)
Neutrophils Relative %: 85 %
Platelets: 189 10*3/uL (ref 150–400)
RBC: 3.01 MIL/uL — ABNORMAL LOW (ref 4.22–5.81)
RDW: 18.5 % — ABNORMAL HIGH (ref 11.5–15.5)
WBC: 23.5 10*3/uL — ABNORMAL HIGH (ref 4.0–10.5)
nRBC: 14 % — ABNORMAL HIGH (ref 0.0–0.2)

## 2019-09-06 LAB — RENAL FUNCTION PANEL
Albumin: 2.5 g/dL — ABNORMAL LOW (ref 3.5–5.0)
Anion gap: 18 — ABNORMAL HIGH (ref 5–15)
BUN: 34 mg/dL — ABNORMAL HIGH (ref 6–20)
CO2: 16 mmol/L — ABNORMAL LOW (ref 22–32)
Calcium: 8 mg/dL — ABNORMAL LOW (ref 8.9–10.3)
Chloride: 93 mmol/L — ABNORMAL LOW (ref 98–111)
Creatinine, Ser: 5.99 mg/dL — ABNORMAL HIGH (ref 0.61–1.24)
GFR calc Af Amer: 12 mL/min — ABNORMAL LOW (ref >60)
GFR calc non Af Amer: 10 mL/min — ABNORMAL LOW (ref >60)
Glucose, Bld: 91 mg/dL (ref 70–99)
Phosphorus: 4.3 mg/dL (ref 2.5–4.6)
Potassium: 2.9 mmol/L — ABNORMAL LOW (ref 3.5–5.1)
Sodium: 127 mmol/L — ABNORMAL LOW (ref 135–145)

## 2019-09-06 LAB — IRON AND TIBC: Iron: 105 ug/dL (ref 45–182)

## 2019-09-06 LAB — PROTIME-INR
INR: 1.6 — ABNORMAL HIGH (ref 0.8–1.2)
Prothrombin Time: 18.8 seconds — ABNORMAL HIGH (ref 11.4–15.2)

## 2019-09-06 LAB — MAGNESIUM: Magnesium: 2.4 mg/dL (ref 1.7–2.4)

## 2019-09-06 LAB — PREPARE RBC (CROSSMATCH)

## 2019-09-06 LAB — HEPATIC FUNCTION PANEL
ALT: 33 U/L (ref 0–44)
AST: 115 U/L — ABNORMAL HIGH (ref 15–41)
Albumin: 2.4 g/dL — ABNORMAL LOW (ref 3.5–5.0)
Alkaline Phosphatase: 190 U/L — ABNORMAL HIGH (ref 38–126)
Bilirubin, Direct: >30 mg/dL — ABNORMAL HIGH (ref 0.0–0.2)
Total Bilirubin: >50 mg/dL (ref 0.3–1.2)
Total Protein: 6.5 g/dL (ref 6.5–8.1)

## 2019-09-06 LAB — GLUCOSE, CAPILLARY: Glucose-Capillary: 90 mg/dL (ref 70–99)

## 2019-09-06 LAB — PHOSPHORUS: Phosphorus: 4.2 mg/dL (ref 2.5–4.6)

## 2019-09-06 LAB — PATHOLOGIST SMEAR REVIEW

## 2019-09-06 LAB — AMMONIA: Ammonia: 100 umol/L — ABNORMAL HIGH (ref 9–35)

## 2019-09-06 LAB — FERRITIN: Ferritin: 3259 ng/mL — ABNORMAL HIGH (ref 24–336)

## 2019-09-06 MED ORDER — MORPHINE 100MG IN NS 100ML (1MG/ML) PREMIX INFUSION
0.0000 mg/h | INTRAVENOUS | Status: DC
  Administered 2019-09-06: 18:00:00 12 mg/h via INTRAVENOUS

## 2019-09-06 MED ORDER — MORPHINE 100MG IN NS 100ML (1MG/ML) PREMIX INFUSION
0.0000 mg/h | INTRAVENOUS | Status: DC
  Administered 2019-09-06: 17:00:00 2 mg/h via INTRAVENOUS
  Filled 2019-09-06: qty 100

## 2019-09-06 MED ORDER — SODIUM CHLORIDE 0.9 % IV SOLN
1.0000 g | INTRAVENOUS | Status: DC
  Administered 2019-09-06: 10:00:00 1 g via INTRAVENOUS
  Filled 2019-09-06 (×2): qty 10

## 2019-09-06 MED ORDER — MORPHINE BOLUS VIA INFUSION
5.0000 mg | INTRAVENOUS | Status: DC | PRN
  Filled 2019-09-06: qty 5

## 2019-09-06 MED ORDER — ORAL CARE MOUTH RINSE
15.0000 mL | Freq: Two times a day (BID) | OROMUCOSAL | Status: DC
  Administered 2019-09-06: 12:00:00 15 mL via OROMUCOSAL

## 2019-09-06 MED ORDER — GLYCOPYRROLATE 0.2 MG/ML IJ SOLN: 0.2000 mg | INTRAMUSCULAR | Status: DC | PRN

## 2019-09-06 MED ORDER — OCTREOTIDE ACETATE 100 MCG/ML IJ SOLN
100.0000 ug | Freq: Two times a day (BID) | INTRAMUSCULAR | Status: DC
  Administered 2019-09-06: 10:00:00 100 ug via SUBCUTANEOUS
  Filled 2019-09-06 (×3): qty 1

## 2019-09-06 MED ORDER — POTASSIUM CHLORIDE 10 MEQ/100ML IV SOLN
10.0000 meq | INTRAVENOUS | Status: AC
  Administered 2019-09-06 (×6): 10 meq via INTRAVENOUS
  Filled 2019-09-06 (×7): qty 100

## 2019-09-06 MED ORDER — MORPHINE SULFATE (PF) 2 MG/ML IV SOLN
2.0000 mg | INTRAVENOUS | Status: DC | PRN
  Administered 2019-09-06: 17:00:00 2 mg via INTRAVENOUS

## 2019-09-06 MED ORDER — GLYCOPYRROLATE 1 MG PO TABS: 1.0000 mg | ORAL_TABLET | ORAL | Status: DC | PRN

## 2019-09-06 MED ORDER — LORAZEPAM 2 MG/ML IJ SOLN
1.0000 mg | INTRAMUSCULAR | Status: DC | PRN
  Administered 2019-09-06 (×2): 2 mg via INTRAVENOUS
  Filled 2019-09-06 (×3): qty 1

## 2019-09-06 MED ORDER — VITAL HIGH PROTEIN PO LIQD: 1000.0000 mL | ORAL | Status: DC

## 2019-09-06 MED ORDER — POLYVINYL ALCOHOL 1.4 % OP SOLN
1.0000 [drp] | Freq: Four times a day (QID) | OPHTHALMIC | Status: DC | PRN
  Filled 2019-09-06: qty 15

## 2019-09-06 MED ORDER — MORPHINE SULFATE (PF) 2 MG/ML IV SOLN
2.0000 mg | INTRAVENOUS | Status: DC | PRN
  Administered 2019-09-06 (×3): 2 mg via INTRAVENOUS
  Filled 2019-09-06 (×4): qty 1

## 2019-09-06 MED ORDER — DIPHENHYDRAMINE HCL 50 MG/ML IJ SOLN: 25.0000 mg | INTRAMUSCULAR | Status: DC | PRN

## 2019-09-06 MED ORDER — ALBUMIN HUMAN 25 % IV SOLN
75.0000 g | Freq: Every day | INTRAVENOUS | Status: DC
  Administered 2019-09-06: 11:00:00 75 g via INTRAVENOUS
  Filled 2019-09-06: qty 300

## 2019-09-06 MED ORDER — MIDODRINE HCL 5 MG PO TABS
10.0000 mg | ORAL_TABLET | Freq: Three times a day (TID) | ORAL | Status: DC
  Administered 2019-09-06: 13:00:00 10 mg via ORAL
  Filled 2019-09-06: qty 2

## 2019-09-06 MED ORDER — VITAL 1.5 CAL PO LIQD
1000.0000 mL | ORAL | Status: DC
  Filled 2019-09-06 (×2): qty 1000

## 2019-09-06 MED ORDER — QUETIAPINE FUMARATE 50 MG PO TABS: 200.0000 mg | ORAL_TABLET | Freq: Every day | ORAL | Status: DC

## 2019-09-06 MED ORDER — SODIUM CHLORIDE 0.9% IV SOLUTION: Freq: Once | INTRAVENOUS | Status: AC

## 2019-09-06 MED ORDER — PRO-STAT SUGAR FREE PO LIQD: 30.0000 mL | Freq: Every day | ORAL | Status: DC

## 2019-09-06 MED ORDER — DEXTROSE 5 % IV SOLN: INTRAVENOUS | Status: DC

## 2019-09-07 LAB — URINE CULTURE: Culture: NO GROWTH

## 2019-09-07 LAB — BPAM RBC
Blood Product Expiration Date: 202105102359
Blood Product Expiration Date: 202105112359
Blood Product Expiration Date: 202105112359
ISSUE DATE / TIME: 202104122206
ISSUE DATE / TIME: 202104131659
ISSUE DATE / TIME: 202104140550
Unit Type and Rh: 9500
Unit Type and Rh: 9500
Unit Type and Rh: 9500

## 2019-09-07 LAB — TYPE AND SCREEN
ABO/RH(D): O NEG
Antibody Screen: NEGATIVE
Unit division: 0
Unit division: 0
Unit division: 0

## 2019-09-07 LAB — CERULOPLASMIN: Ceruloplasmin: 22.1 mg/dL (ref 16.0–31.0)

## 2019-09-07 LAB — ANA: Anti Nuclear Antibody (ANA): NEGATIVE

## 2019-09-07 LAB — IGG: IgG (Immunoglobin G), Serum: 1051 mg/dL (ref 603–1613)

## 2019-09-09 LAB — ANTI-SMOOTH MUSCLE ANTIBODY, IGG: F-Actin IgG: 11 Units (ref 0–19)

## 2019-09-10 LAB — CULTURE, BLOOD (ROUTINE X 2)
Culture: NO GROWTH
Culture: NO GROWTH
Special Requests: ADEQUATE
Special Requests: ADEQUATE

## 2019-09-23 NOTE — Progress Notes (Signed)
Waste 47ml morphine, with Luvenia Heller RN witness.

## 2019-09-23 NOTE — Progress Notes (Signed)
Updated father, Arthur [name], patient's status has declined and he is now requiring a non-rebreather mask to help with oxygen levels. Father unsure if he will be able to return to hospital. Advised Mr. Seago to call if he has any questions or concerns.

## 2019-09-23 NOTE — Progress Notes (Signed)
CRITICAL VALUE ALERT  Critical Value: 6.5  Date & Time Notied: 10/02/2019     0515  Provider Notified: MD Arsenio Loader  Orders Received/Actions taken: 1 unit PRBC ordered

## 2019-09-23 NOTE — Progress Notes (Signed)
Patient ID: Arthur [name], male   DOB: 1971-04-02, 49 y.o.   MRN: [id number] [facility] Progress Note   Assessment/ Plan:   1. 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  We will continue albumin and begin midodrine/octreotide for medical management of presumptive HRS.  I would not recommend any renal replacement therapy with current burden of comorbidities and low likelihood of liver transplant-comfort/palliative care would be the preferable option if renal function does not improve with trial of medical management. 2.  Hyponatremia: Hypoosmolar hyponatremia with urine osmolality of 280 which indicates inappropriate ADH release.  Continue daily fluid restriction of <1.5 L/day and supportive management with albumin infusion. 3.  Alcoholic hepatitis: With severe hyperbilirubinemia and evidence of hepatic synthetic defect with hypoalbuminemia and elevated INR/coagulopathy. . 4.  Anemia: Likely secondary to chronic illness/poor intake versus occult bleed in the setting of alcoholic liver disease.  Status post PRBC transfusion, continue to follow H/H and transfuse as indicated.  Subjective:   Lethargic and asking for water.    Objective:   BP (!) 96/58   Pulse 85   Temp 98.8 F (37.1 C) (Oral)   Resp (!) 23   Ht 5\' 10"  (1.778 m)   Wt 74.5 kg   SpO2 95%   BMI 23.57 kg/m   Intake/Output Summary (Last 24 hours) at 09/15/2019 0736 Last data filed at 08/30/2019 0600 Gross per 24 hour  Intake 2472.5 ml  Output 1000 ml  Net 1472.5 ml   Weight change: 5.76 kg  Physical Exam: Gen: Appears uncomfortable resting in bed, NGT in-situ, Icteric CVS: Pulse regular rhythm and normal rate, S1 and S2 with ejection systolic murmur Resp: Anteriorly clear to auscultation, no rales/rhonchi Abd: Soft, moderately globally distended nontender with mild lower quadrant tenderness. Ext:  No edema over lower extremities  Imaging: CT ABDOMEN PELVIS WO CONTRAST  Result Date: 09/05/2019 CLINICAL DATA:  Jaundice, distended abdomen, elevated LFTs EXAM: CT ABDOMEN AND PELVIS WITHOUT CONTRAST TECHNIQUE: Multidetector CT imaging of the abdomen and pelvis was performed following the standard protocol without IV contrast. COMPARISON:  Same day abdominal ultrasound FINDINGS: Lower chest: Trace bilateral pleural effusions. Hepatobiliary: Hepatomegaly, maximum coronal span 25.6 cm, with profound hypodensity of the hepatic parenchyma. No gallstones, gallbladder wall thickening, or biliary dilatation. Pancreas: Unremarkable. No pancreatic ductal dilatation or surrounding inflammatory changes. Spleen: Splenomegaly, maximum coronal span 16.8 cm. Adrenals/Urinary Tract: Adrenal glands are unremarkable. Kidneys are normal, without renal calculi, solid lesion, or hydronephrosis. Bladder is unremarkable. Stomach/Bowel: Stomach is within normal limits. Appendix is not clearly visualized. No evidence of bowel wall thickening, distention, or inflammatory changes. Vascular/Lymphatic: Aortic atherosclerosis. No enlarged abdominal or pelvic lymph nodes. Reproductive: No mass or other significant abnormality. Other: No abdominal wall hernia or abnormality. Small volume ascites. Musculoskeletal: No acute or significant osseous findings. IMPRESSION: 1. Hepatomegaly with profound hypodensity of hepatic parenchyma, consistent with severe hepatic steatosis and or acute hepatitis. 2.  Splenomegaly. 3.  Small volume ascites. 4.  Aortic Atherosclerosis (ICD10-I70.0). Electronically Signed   By: Eddie Candle M.D.   On: 09/05/2019 14:56   US Abdomen Complete  Result Date: 09/05/2019 CLINICAL DATA:  Abdomen pain hyperbilirubinemia EXAM: ABDOMEN ULTRASOUND COMPLETE COMPARISON:  None. FINDINGS: Gallbladder: No shadowing stones. Increased wall thickness up to 14 mm with gallbladder wall edema. Trace pericholecystic fluid. Negative  sonographic Murphy. Common bile duct: Diameter: Up to 7.7 mm Liver: Liver is echogenic. Liver is enlarged.  No focal hepatic abnormality. Portal vein is patent on color Doppler imaging with normal direction of blood flow towards the liver. IVC: No abnormality visualized. Pancreas: Visualized portion unremarkable. Spleen: Enlarged with volume of 352.3 mL. Right Kidney: Length: 12.6 cm. Echogenicity within normal limits. No mass or hydronephrosis visualized. Left Kidney: Length: 12.6 cm. Echogenicity within normal limits. No mass or hydronephrosis visualized. Abdominal aorta: No aneurysm visualized. Distal aorta and bifurcation are obscured by bowel gas. Maximum diameter of 2.6 cm. Other findings: None. IMPRESSION: 1. Enlarged echogenic liver consistent with steatosis and or hepatocellular disease. Splenomegaly. 2. Gallbladder wall thickening and small amount of pericholecystic fluid but no shadowing stones or evidence for sonographic Murphy. Findings are indeterminate for cholecystitis. Further evaluation with nuclear medicine hepatobiliary imaging could be evaluate for acute gallbladder disease. 3. Common bile duct slightly enlarged.  Correlate with LFTs. Electronically Signed   By: Jasmine Pang M.D.   On: 09/05/2019 03:43   DG Abd Portable 1V  Result Date: 09/05/2019 CLINICAL DATA:  Nasogastric tube placement. EXAM: PORTABLE ABDOMEN - 1 VIEW COMPARISON:  None. FINDINGS: The bowel gas pattern is normal. Distal tip of nasogastric tube is seen in proximal stomach. Distal side hole appears to be in the distal esophagus. No radio-opaque calculi or other significant radiographic abnormality are seen. IMPRESSION: Distal tip of nasogastric tube seen in proximal stomach. Distal side hole appears to be in distal esophagus. Advancement is recommended. Electronically Signed   By: Lupita Raider M.D.   On: 09/05/2019 17:57   IR ABDOMEN US LIMITED  Result Date: 09/05/2019 CLINICAL DATA:  48 year old with history of  abdominal pain. Evaluate for ascites. EXAM: LIMITED ABDOMEN ULTRASOUND FOR ASCITES TECHNIQUE: Limited ultrasound survey for ascites was performed in all four abdominal quadrants. COMPARISON:  Abdominal ultrasound 09/05/2019 FINDINGS: Negative for ascites in the 4 quadrants of the abdomen. IMPRESSION: Negative for ascites. Electronically Signed   By: Richarda Overlie M.D.   On: 09/05/2019 14:07    Labs: BMET Recent Labs  Lab 09/13/2019 1954 09/05/19 0625 09/05/19 1007 09/05/19 1626 08/28/2019 0348  NA 121* 121* 120* 124* 127*  K 2.5* 3.3* 3.2* 3.2* 2.9*  CL 82* 85* 85* 88* 93*  CO2 19* 19* 19* 18* 16*  GLUCOSE 96 92 85 83 91  BUN 25* 29* 30* 32* 34*  CREATININE 5.02* 5.25* 5.42* 5.48* 5.99*  CALCIUM 8.5* 8.4* 8.2* 8.2* 8.0*  PHOS  --   --   --   --  4.3  4.2   CBC Recent Labs  Lab 2019/09/13 1954 09/05/19 0625 09/05/19 2131 09/17/2019 0348  WBC 26.6* 26.1* 24.1* 20.8*  NEUTROABS  --  22.1*  --   --   HGB 6.0* 6.4* 7.0* 6.8*  HCT 19.3* 19.7* 21.3* 20.6*  MCV 78.1* 80.1 79.2* 80.2  PLT 262 221 197 184   Medications:    . sodium chloride   Intravenous Once  . Chlorhexidine Gluconate Cloth  6 each Topical Daily  . folic acid  1 mg Oral Daily  . lactulose  30 g Per Tube QID  . multivitamin with minerals  1 tablet Oral Daily  . pantoprazole (PROTONIX) IV  40 mg Intravenous Q12H  . thiamine  100 mg Oral Daily   Or  . thiamine  100 mg Intravenous Daily   Zetta Bills, MD 09/10/2019, 7:36 AM

## 2019-09-23 NOTE — Progress Notes (Signed)
Initial Nutrition Assessment  DOCUMENTATION CODES:   Not applicable  INTERVENTION:   Tube feeding:  -Vital 1.5 @ 20 ml/hr via Cortrak -Increase by 10 ml Q4 hours to goal rate of 65 ml/hr (1560 ml) -30 ml Prostat once daily   At goal TF provides: 2440 kcals, 120 grams protein, 1192 ml free water.   Monitor magnesium, potassium, and phosphorus daily for at least 3 days, MD to replete as needed, as pt is at risk for refeeding syndrome.   NUTRITION DIAGNOSIS:   Increased nutrient needs related to acute illness as evidenced by estimated needs.  GOAL:   Patient will meet greater than or equal to 90% of their needs  MONITOR:   PO intake, Supplement acceptance, Weight trends, Labs, I & O's, Skin  REASON FOR ASSESSMENT:   Consult Enteral/tube feeding initiation and management  ASSESSMENT:   Patient with PMH significant for ETOH abuse and HTN. Presents this admission with ARF likely secondary to hepatorenal syndrome and alcoholic hepatitis.   Pt made DNR. No further escalation of care.   Unable to obtain history from pt as pt remains confused/sedated. Per GI pt is homeless and drinks 1/2 gallon of 90 proof vodka every three days. Will need to obtain weight/nutrition history if possible. Pt had gastric Cortrak placed at bedside. Titrate tube feeding to goal. Monitor for refeeding.   Weight records limited over the last year. Use 73.8 kg as EDW for now.   Renal function worse. UOP decreased. Not a candidate for CRRT per Nephrology.   I/O: +2,576 ml since admit  UOP: 200 ml x 24 hrs   Drips: albumin, precedex Medications: folic acid, lactulose, MVI with minerals, thiamine Labs: Na 127 (L) K 2.9 (L) Cr 5.99-trending up LFTs up  Diet Order:   Diet Order            Diet NPO time specified  Diet effective now              EDUCATION NEEDS:   Not appropriate for education at this time  Skin:  Skin Assessment: Skin Integrity Issues: Skin Integrity Issues:: Other  (Comment) Other: MASD- bilateral groin  Last BM:  4/14  Height:   Ht Readings from Last 1 Encounters:  09/05/19 5\' 10"  (1.778 m)    Weight:   Wt Readings from Last 1 Encounters:  09/05/2019 74.5 kg    BMI:  Body mass index is 23.57 kg/m.  Estimated Nutritional Needs:   Kcal:  2250-2450 kcal  Protein:  110-125 grams  Fluid:  1.5 L fluid restriction   09/08/19 RD, LDN Clinical Nutrition Pager listed in AMION

## 2019-09-23 NOTE — Progress Notes (Signed)
eLink Physician-Brief Progress Note Patient Name: Arthur [name] DOB: 01-13-1971 MRN: [id number]   Date of Service  09/09/2019  HPI/Events of Note  Oliguria - Bladder scan residual = 192. Question accuracy.   eICU Interventions  Will order: 1. I/O Cath PRN.     Intervention Category Major Interventions: Other:  Lenell Antu 09/18/2019, 3:54 AM

## 2019-09-23 NOTE — Progress Notes (Signed)
Sudden resp distress requiring NRB Crackles on RT  ? Aspiration vs fluid overload  DNR, will use morphine for comfort If no relief with prn morphine, will use drip Sister updated  Janalyn Higby V. Vassie Loll MD

## 2019-09-23 NOTE — Progress Notes (Signed)
eLink Physician-Brief Progress Note Patient Name: Arthur [name] DOB: [birth year] MRN: [id number]   Date of Service  09/05/2019  HPI/Events of Note  Hypokalemia - K+ = 2.9. Likely d/t diarrhea. Not sure that he will absorb oral K given that site of K+ loss is GI tract.    eICU Interventions  Will replace K+.      Intervention Category Major Interventions: Electrolyte abnormality - evaluation and management  Kyair Ditommaso Eugene 09/07/2019, 5:28 AM

## 2019-09-23 NOTE — Procedures (Signed)
Cortrak  Tube Location:  Left nare Initial Placement:  Stomach Secured by: Bridle Technique Used to Measure Tube Placement:  Documented cm marking at nare/ corner of mouth Cortrak Secured At:  80 cm Procedure Comments:  Difficulty placing bridle    Cortrak Tube Team Note:  Consult received to place a Cortrak feeding tube.   No x-ray is required. RN may begin using tube.   If the tube becomes dislodged please keep the tube and contact the Cortrak team at [email or web address] (password [email or web address]) for replacement.  If after hours and replacement cannot be delayed, place a NG tube and confirm placement with an abdominal x-ray.    Betsey Holiday MS, RD, LDN Please refer to [facility] for RD and/or RD on-call/weekend/after hours pager

## 2019-09-23 NOTE — Progress Notes (Signed)
Start shift with patient on morphine gtt on comfort care.  Family at bedside.  Patient struggling for breaths and is unresponsive.  5mg  bolus morphine given as per order.some blood noted to be squirting out of nare into non rebreather.  Patient continued to be kept comfortable as needed.  T.O.D noted as 2129.  It appeared to be peaceful.  Family stayed for a little longer and then left.M.E called and this case might be M.E case.  Will prep and deliver to morgue.

## 2019-09-23 NOTE — Progress Notes (Signed)
NAME:  Dantae Meunier, MRN:  [id number], DOB:  November 13, 1970, LOS: 2 ADMISSION DATE:  08/25/2019, CONSULTATION DATE:  09/05/2019 REFERRING MD:  Dr. Maylene Roes, CHIEF COMPLAINT:  Jaundice   Brief History   Mr. Henney is a 49 year old male with a medical history of alcohol use disorder, depression, schizophrenia, and OUD who presented to the ED esterday with several complaints including generalized weakness, N/V, and jaundice. Was found to be in decompensated cirrhosis, acute renal failure, severe hyponatremia and AoC anemia.   History of present illness   Mr. Santa is a 49 year old male with a medical history of alcohol use disorder, depression, schizophrenia, and OUD who presented to the ED yesterday with several complaints including generalized weakness, N/V, and jaundice. He is unable to provide history at this time and HPI obtained from the chart which is also limited. Patient went to PCP about 2 weeks ago and was told he was in acute liver failure and advised to go to the ED immediately. However, he presented today. He was brought in by EMS from a friends house. Family not present in the room.   ED course: He was afebrile, tachycardic, normotensive and oxygenating 93% in room air. Blood work remarkable WBC 26.6, Hgb 6.0, plt 262, Na 121, K 2.5, bicarb 19, Cr 5, AST/ALT 144/40, Alk Phos 241, T bili > 50, INR 1.5, ammonia 91, lipase 28. Acute hepatitis panel was negative. EtOH level unable to obtain due to unidentified substance. EKG showed sinus tachycardia withotu signs of acute ischemia. US abdomen concerning for possible cholecystitis.   Past Medical History  AUD OUD Depression  Schizophrenia  Thalassemia?   Significant Hospital Events   4/12 Admission  4/13 Transfer to the ICU   Consults:  GI, nephrology   Procedures:  None   Significant Diagnostic Tests:  4/13 US abdomen > IMPRESSION: 1. Enlarged echogenic liver consistent with steatosis and or hepatocellular disease.  Splenomegaly. 2. Gallbladder wall thickening and small amount of pericholecystic fluid but no shadowing stones or evidence for sonographic Murphy. Findings are indeterminate for cholecystitis. Further evaluation with nuclear medicine hepatobiliary imaging could be evaluate for acute gallbladder disease. 3. Common bile duct slightly enlarged.  Correlate with LFTs.  4/13 CT abdomen/pelvis > IMPRESSION: 1. Hepatomegaly with profound hypodensity of hepatic parenchyma, consistent with severe hepatic steatosis and or acute hepatitis. 2.  Splenomegaly. 3.  Small volume ascites. 4.  Aortic Atherosclerosis (ICD10-I70.0).  Micro Data:  4/12 COVID negative   Antimicrobials:  Ceftriaxone 4/12>   Interim history/subjective:  Alert this AM. Remains encephaloOriented to self only, believes he is in [place]. Asking for something to drink.  Objective   Blood pressure 103/74, pulse 95, temperature 98.6 F (37 C), temperature source Axillary, resp. rate 18, height 5' 10"  (1.778 m), weight 74.5 kg, SpO2 92 %.        Intake/Output Summary (Last 24 hours) at 09-07-19 1027 Last data filed at 07-Sep-2019 0853 Gross per 24 hour  Intake 2992.11 ml  Output 1000 ml  Net 1992.11 ml   Filed Weights   09/12/2019 1847 09/05/19 1729 09/07/2019 0500  Weight: 68 kg 73.8 kg 74.5 kg    Examination: General: Disheveled, sleeping in bed, Confused when alert  HENT, scleral icterus  Lungs: Wheezing, no increased WOB  Cardiovascular: RRR, no mrg  Abdomen: distended, hypoactive bowel sounds, HSM present  Extremities: warm and well perfused, no edema  Neuro: Encephalopathic, oriented to self only. 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  Hepatic encephalopathy Coagulopathy  Alcoholic hepatitis  Alcohol use disorder  - GI Following - Acute insult: Alcohol vs ?cholecystitis ([place] abd with gallbladder  wall thickening and pericholecystic fluid, high alk Phos and Tbili) vs Infection (leukocytosis but afebrile), etc  - Acute hepatitis panel negative, Acetaminophen < 10  - Follow up ANA, anti-smooth muscle Ab, ceruloplasmin, and IgG  - Continue Ceftriaxone for SBP Ppx - Continue MVI, folate, and thiamine   - He pulled his NGT, Place Cortrak - Continuet lactulose 30g QID, goal of 3-4 BMs per day - CIWA with Precedex and Ativan  - BlCx: NGTD - Discussed prognosis with family, he has been made DNR. No other life support measures (No Dialysis, No Pressors).  Will continue supportive care as above and below.  Acute renal failure with oliguria, suspect hepatorenal syndrome Hypokalemia  - Nephrology followin - Minimal UOP, FeNa <0.3% (Pre-renal) - Continue trial of albumin, octreotide, Midodrine per nephrology recs  - Strict I/Os and daily weights  - Continue to monitor electrolytes and replete PRN - After goals of care discussion with patient's father, will not pursue any HD, palliative has been consulted 4/13, will continue supportive measures, patient is not comfort care at this time   Acute on chronic anemia  - History of thalassemia with baseline Hgb 9-10 - Hgb 6.8 this AM, s/p 2U pRBCs > 1U ordered by E-link- IV PPI  - No obvious source of bleeding, FOBT negative. Renal Failure vs chronic illness vs alcohol use   - No history of varices in chart, no prior EGDs   - Daily CBCs  Hyponatremia secondary to SIADH - Fluid restriction < 1.5L  - Continue albumin as above  - Trend BMPs   ** POA is father, Doniel Maiello, [phone]). Also, Illene Silver [phone]) is caregiver. [facility] [phone]) is adoptive "sister" whose family took patient in about 15 years ago.  Best practice:  Diet: NPO  Pain/Anxiety/Delirium protocol (if indicated): N/A, will start precedex if signs of withdrawal  VAP protocol (if indicated): not intubated  DVT prophylaxis: SCDs  GI prophylaxis:  PPI Glucose control: SSI  Mobility: bed rest  Code Status: DNR  Family Communication: - 4/14 Family updated at bedside: Decision made to make patient DNR and Avoid life support measures of HD/Renal replacement nor Pressor support. Will continue supportive care as above and monitor. Disposition: ICU   Labs   CBC: Recent Labs  Lab 09/07/2019 1954 09/05/19 0625 09/05/19 2131 22-Sep-2019 0348  WBC 26.6* 26.1* 24.1* 20.8*  NEUTROABS  --  22.1*  --   --   HGB 6.0* 6.4* 7.0* 6.8*  HCT 19.3* 19.7* 21.3* 20.6*  MCV 78.1* 80.1 79.2* 80.2  PLT 262 221 197 416    Basic Metabolic Panel: Recent Labs  Lab 09/13/2019 1954 09/11/2019 2151 09/05/19 0625 09/05/19 1007 09/05/19 1626 09/22/19 0348  NA 121*  --  121* 120* 124* 127*  K 2.5*  --  3.3* 3.2* 3.2* 2.9*  CL 82*  --  85* 85* 88* 93*  CO2 19*  --  19* 19* 18* 16*  GLUCOSE 96  --  92 85 83 91  BUN 25*  --  29* 30* 32* 34*  CREATININE 5.02*  --  5.25* 5.42* 5.48* 5.99*  CALCIUM 8.5*  --  8.4* 8.2* 8.2* 8.0*  MG  --  1.6*  --   --   --  2.4  PHOS  --   --   --   --   --  4.3  4.2   GFR: Estimated Creatinine Clearance: 15.7 mL/min (A) (by C-G formula based on SCr of 5.99 mg/dL (H)). Recent Labs  Lab 09/03/2019 1954 09/05/19 0625 09/05/19 2131 09-19-2019 0348  WBC 26.6* 26.1* 24.1* 20.8*    Liver Function Tests: Recent Labs  Lab 08/24/2019 1954 09/05/19 1007 Sep 19, 2019 0348  AST 144* 122* 115*  ALT 40 33 33  ALKPHOS 241* 205* 190*  BILITOT >50.0* 48.4* >50.0*  PROT 7.2 6.5 6.5  ALBUMIN 1.9* 2.0* 2.4*  2.5*   Recent Labs  Lab 08/27/2019 1954  LIPASE 28   Recent Labs  Lab 09/20/2019 1954 2019-09-19 0348  AMMONIA 91* 100*    ABG No results found for: PHART, PCO2ART, PO2ART, HCO3, TCO2, ACIDBASEDEF, O2SAT   Coagulation Profile: Recent Labs  Lab 09/13/2019 1954 09/05/19 0625 2019/09/19 0348  INR 1.5* 1.6* 1.6*    Cardiac Enzymes: No results for input(s): CKTOTAL, CKMB, CKMBINDEX, TROPONINI in the last 168  hours.  HbA1C: Hgb A1c MFr Bld  Date/Time Value Ref Range Status  01/28/2018 06:33 AM 4.4 (L) 4.8 - 5.6 % Final    Comment:    (NOTE) Pre diabetes:          5.7%-6.4% Diabetes:              >6.4% Glycemic control for   <7.0% adults with diabetes     CBG: No results for input(s): GLUCAP in the last 168 hours.  Review of Systems:   Unable to provide due to altered mentation.   Past Medical History  He,  has a past medical history of ETOH abuse, Heart murmur, Hypertension, Panic attack, and Thalassemia.   Surgical History   History reviewed. No pertinent surgical history.   Social History   reports that he has been smoking. He has been smoking about 1.00 pack per day. He has never used smokeless tobacco. He reports current alcohol use of about 25.0 standard drinks of alcohol per week. He reports current drug use. Drug: Marijuana.   Family History   His family history includes Cancer in his mother; Mental illness in his father and mother.   Allergies Allergies  Allergen Reactions  . Haldol [Haloperidol Lactate] Other (See Comments)    "LOCK JAW"  . Risperidone And Related Other (See Comments)    Prefers to not take -- "makes you grow breasts," per the patient     Home Medications  Prior to Admission medications   Medication Sig Start Date End Date Taking? Authorizing Provider  busPIRone (BUSPAR) 30 MG tablet Take 30 mg by mouth 2 (two) times daily.   Yes [provider]  Ensure (ENSURE) Take 237 mLs by mouth daily as needed (low appetite).   Yes [provider]  folic acid (FOLVITE) 314 MCG tablet Take 800 mcg by mouth 2 (two) times daily.   Yes [provider]  loratadine (CLARITIN) 10 MG tablet Take 10 mg by mouth daily.   Yes [provider]  modafinil (PROVIGIL) 200 MG tablet Take 200 mg by mouth daily.   Yes [provider]  montelukast (SINGULAIR) 10 MG tablet Take 10 mg by mouth at bedtime.   Yes [provider]  Multiple Vitamins-Minerals (MULTIVITAMIN WITH MINERALS) tablet Take 1 tablet by mouth daily.   Yes [provider]  omeprazole (PRILOSEC) 20 MG capsule Take 20 mg by mouth daily.   Yes [provider]  prazosin (MINIPRESS) 1 MG capsule Take 1 mg by mouth 3 (three) times daily.  Yes [provider]  QUEtiapine (SEROQUEL) 400 MG tablet Take 800 mg by mouth at bedtime.    Yes [provider]  senna (SENOKOT) 8.6 MG TABS tablet Take 2 tablets by mouth daily.   Yes [provider]    Pearson Grippe, DO IM PGY-3 Pager: [phone]

## 2019-09-23 NOTE — Progress Notes (Signed)
[facility] Progress Note  CC: alcoholic hepatitis, decompensated possible hepatorenal syndrome    Subjective: Patient is sedated. Briefly moaned out. RN reports he intermittently articulates a few words that are comprehensible. Family were at the bedside earlier this morning. Patient was made a DNR.  His NGT was inadvertently removed. Core trak team to replace NGT. Lactulose on hold until NGT replaced.    Objective:   Abdominal/pelvic CT without contrast 09/05/2019: 1. Hepatomegaly with profound hypodensity of hepatic parenchyma, consistent with severe hepatic steatosis and or acute hepatitis. 2.  Splenomegaly. 3.  Small volume ascites. 4.  Aortic Atherosclerosis   Vital signs in last 24 hours: Temp:  [97.6 F (36.4 C)-99.1 F (37.3 C)] 98.6 F (37 C) (04/14 0853) Pulse Rate:  [76-225] 81 (04/14 1000) Resp:  [16-30] 19 (04/14 1000) BP: (82-137)/(49-86) 94/53 (04/14 1000) SpO2:  [76 %-100 %] 97 % (04/14 1000) Weight:  [73.8 kg-74.5 kg] 74.5 kg (04/14 0500) Last BM Date: 09/17/19   General:  Ill appearing male. Eye: Sclera grossly icteric.  Heart: RRR, no murmur.  Pulm:  Coarse ins/expiratory breath sounds throughout.  Abdomen: Protuberant, distended,  HSM confirmed by CT.  Extremities:  Without edema. Neurologic:  Alert and  oriented x4;  grossly normal neurologically. Psych:  Alert and cooperative. Normal mood and affect. Skin: Severely jaundiced.   Intake/Output from previous day: 04/13 0701 - 04/14 0700 In: 2571.3 [I.V.:246.1; Blood:430; NG/GT:90; IV Piggyback:1805.2] Out: 1000 [Urine:200; Stool:800] Intake/Output this shift: Total I/O In: 689.7 [I.V.:55.4; Blood:315; IV Piggyback:319.3] Out: -    Rectal output 1L past 24 hours  Lab Results: Recent Labs    09/05/19 2131 09-17-19 0348 September 17, 2019 1000  WBC 24.1* 20.8* 23.5*  HGB 7.0* 6.8* 8.1*  HCT 21.3* 20.6* 24.3*  PLT 197 184 189   BMET Recent Labs    09/05/19 1007 09/05/19 1626  17-Sep-2019 0348  NA 120* 124* 127*  K 3.2* 3.2* 2.9*  CL 85* 88* 93*  CO2 19* 18* 16*  GLUCOSE 85 83 91  BUN 30* 32* 34*  CREATININE 5.42* 5.48* 5.99*  CALCIUM 8.2* 8.2* 8.0*   LFT Recent Labs    09-17-2019 0348  PROT 6.5  ALBUMIN 2.4*  2.5*  AST 115*  ALT 33  ALKPHOS 190*  BILITOT >50.0*  BILIDIR >30.0*  IBILI NOT CALCULATED   PT/INR Recent Labs    09/05/19 0625 2019-09-17 0348  LABPROT 18.9* 18.8*  INR 1.6* 1.6*   Hepatitis Panel Recent Labs    09/10/2019 1954  HEPBSAG NON REACTIVE  HCVAB NON REACTIVE  HEPAIGM NON REACTIVE  HEPBIGM NON REACTIVE    CT ABDOMEN PELVIS WO CONTRAST  Result Date: 09/05/2019 CLINICAL DATA:  Jaundice, distended abdomen, elevated LFTs EXAM: CT ABDOMEN AND PELVIS WITHOUT CONTRAST TECHNIQUE: Multidetector CT imaging of the abdomen and pelvis was performed following the standard protocol without IV contrast. COMPARISON:  Same day abdominal ultrasound FINDINGS: Lower chest: Trace bilateral pleural effusions. Hepatobiliary: Hepatomegaly, maximum coronal span 25.6 cm, with profound hypodensity of the hepatic parenchyma. No gallstones, gallbladder wall thickening, or biliary dilatation. Pancreas: Unremarkable. No pancreatic ductal dilatation or surrounding inflammatory changes. Spleen: Splenomegaly, maximum coronal span 16.8 cm. Adrenals/Urinary Tract: Adrenal glands are unremarkable. Kidneys are normal, without renal calculi, solid lesion, or hydronephrosis. Bladder is unremarkable. Stomach/Bowel: Stomach is within normal limits. Appendix is not clearly visualized. No evidence of bowel wall thickening, distention, or inflammatory changes. Vascular/Lymphatic: Aortic atherosclerosis. No enlarged abdominal or pelvic lymph nodes. Reproductive: No mass or other  significant abnormality. Other: No abdominal wall hernia or abnormality. Small volume ascites. Musculoskeletal: No acute or significant osseous findings. IMPRESSION: 1. Hepatomegaly with profound  hypodensity of hepatic parenchyma, consistent with severe hepatic steatosis and or acute hepatitis. 2.  Splenomegaly. 3.  Small volume ascites. 4.  Aortic Atherosclerosis (ICD10-I70.0). Electronically Signed   By: Eddie Candle M.D.   On: 09/05/2019 14:56   US Abdomen Complete  Result Date: 09/05/2019 CLINICAL DATA:  Abdomen pain hyperbilirubinemia EXAM: ABDOMEN ULTRASOUND COMPLETE COMPARISON:  None. FINDINGS: Gallbladder: No shadowing stones. Increased wall thickness up to 14 mm with gallbladder wall edema. Trace pericholecystic fluid. Negative sonographic Murphy. Common bile duct: Diameter: Up to 7.7 mm Liver: Liver is echogenic. Liver is enlarged. No focal hepatic abnormality. Portal vein is patent on color Doppler imaging with normal direction of blood flow towards the liver. IVC: No abnormality visualized. Pancreas: Visualized portion unremarkable. Spleen: Enlarged with volume of 352.3 mL. Right Kidney: Length: 12.6 cm. Echogenicity within normal limits. No mass or hydronephrosis visualized. Left Kidney: Length: 12.6 cm. Echogenicity within normal limits. No mass or hydronephrosis visualized. Abdominal aorta: No aneurysm visualized. Distal aorta and bifurcation are obscured by bowel gas. Maximum diameter of 2.6 cm. Other findings: None. IMPRESSION: 1. Enlarged echogenic liver consistent with steatosis and or hepatocellular disease. Splenomegaly. 2. Gallbladder wall thickening and small amount of pericholecystic fluid but no shadowing stones or evidence for sonographic Murphy. Findings are indeterminate for cholecystitis. Further evaluation with nuclear medicine hepatobiliary imaging could be evaluate for acute gallbladder disease. 3. Common bile duct slightly enlarged.  Correlate with LFTs. Electronically Signed   By: Donavan Foil M.D.   On: 09/05/2019 03:43   DG Abd Portable 1V  Result Date: 09/05/2019 CLINICAL DATA:  Nasogastric tube placement. EXAM: PORTABLE ABDOMEN - 1 VIEW COMPARISON:  None.  FINDINGS: The bowel gas pattern is normal. Distal tip of nasogastric tube is seen in proximal stomach. Distal side hole appears to be in the distal esophagus. No radio-opaque calculi or other significant radiographic abnormality are seen. IMPRESSION: Distal tip of nasogastric tube seen in proximal stomach. Distal side hole appears to be in distal esophagus. Advancement is recommended. Electronically Signed   By: Marijo Conception M.D.   On: 09/05/2019 17:57   IR ABDOMEN US LIMITED  Result Date: 09/05/2019 CLINICAL DATA:  49 year old with history of abdominal pain. Evaluate for ascites. EXAM: LIMITED ABDOMEN ULTRASOUND FOR ASCITES TECHNIQUE: Limited ultrasound survey for ascites was performed in all four abdominal quadrants. COMPARISON:  Abdominal ultrasound 09/05/2019 FINDINGS: Negative for ascites in the 4 quadrants of the abdomen. IMPRESSION: Negative for ascites. Electronically Signed   By: Markus Daft M.D.   On: 09/05/2019 14:07    Assessment / Plan:  33.  49 year old male with acute alcoholic hepatitis, decompensated  cirrhosis, profound jaundice with T. Bili > 50. Abdominal sonogram showed a dilated CBD, gallbladder wall thickening, cholecystitis could not be excluded. CTAP without contrast 4/13 showed hepatomegaly with severe hepatic steatosis/acute hepatitis, splenomegaly and small volume ascites. Not enough ascites for paracentesis per IR. Today Alk phos 190. AST 115. ALT 33. Total bili > 50. Direct bili > 30. INR 1.6. Poor prognosis. Patient is a DNR.  -Continue Rocephin 1gm IV Q 24hrs for now (SBP prophylaxis) -Repeat hepatic panel, INR in am   2. Hepatic encephalopathy. Ammonia 100. Patient is on Precedex ggt. Received Ativan 46m at 10am per CIWA protocol.  -Lactulose 30gm per NGT reduce to tid (patient had > 1L of stool output past 24  hrs.   3.  Severe AKI. Oliguric. Concerning for developing hepatorenal syndrome vs ATN vs bilirubin cast nephropathy. Urine Na+ 13. Albumin initiated 4/13.  Midodrine 13m po tid and Octreotide 1069m SQ every 12 hrs initiated today by nephrology.  -Appreciated nephrology recommendations  -Agree with palliative care  4.  Macrocytic anemia. Admission Hg 6.0 -> 6.4.  Received 2 unit of packed red blood cells 41/2-4/13.  Today Hg 6.8. Transfused 1 unit PRBCs. Post transfusion H/H  8.1. Iron 105 (collected 4/14 3am). No overt GI bleeding. Stool guaiac negative 4/13.  -Transfuse for Hg < 7.  -Continue Protonix 40 mg IV bid   5.  Hypokalemia.K+ 2.9.  -KCL replacement per CCM  6. Hyponatremia. IADH. Fluid restriction < 1.5L/day per nephrology.  7. Splenomegaly. Normal platelet count 189.   Further recommendations per Dr. MaRush Landmark Principal Problem:   [facility]atore[facility] ([facility] Problems:   Alcohol use disorder, severe, dependence ([facility]  Microcytic hypochromic anemia   Jaundice   Ascites   Hypokalemia   Hypomagnesemia   Alcoholic liver disease ([facility]  Hepatic encephalopathy ([facility]    LOS: 2 days   CoNoralyn Pick4/09/16/2109:16 AM

## 2019-09-23 NOTE — Progress Notes (Signed)
eLink Physician-Brief Progress Note Patient Name: Arthur [name] DOB: 1970-12-18 MRN: [id number]   Date of Service  09/16/2019  HPI/Events of Note  Anemia - Hgb = 6.8.   eICU Interventions  Will transfuse 1 unit PRBC now.      Intervention Category Major Interventions: Other:  Lenell Antu 08/28/2019, 4:47 AM

## 2019-09-23 NOTE — Progress Notes (Signed)
Patient 3 litres positive with now urine output.  Bladder scanned for 197.  MD Arsenio Loader aware, orders for straight cath.  Straight cath for brown, thick muddy looking urine.  Labs drawn, K+ low and Hgb low.  Will be replacing all as ordered

## 2019-09-23 DEATH — deceased

## 2019-10-24 NOTE — Discharge Summary (Signed)
NAME:  Arthur [name], MRN:  [id number], DOB:  August 16, 1970, LOS: 2 ADMISSION DATE:  09/20/2019, CONSULTATION DATE:  09/05/2019 REFERRING MD:  Dr. Maylene Roes, CHIEF COMPLAINT:  Jaundice    History of present illness   Arthur [name] is a 49 year old male with a medical history of alcohol use disorder, depression, schizophrenia, and OUD who presented to the ED yesterday with several complaints including generalized weakness, N/V, and jaundice. He is unable to provide history at this time and HPI obtained from the chart which is also limited. Patient went to PCP about 2 weeks ago and was told he was in acute liver failure and advised to go to the ED immediately. However, he presented today. He was brought in by EMS from a friends house. Family not present in the room.   ED course: He was afebrile, tachycardic, normotensive and oxygenating 93% in room air. Blood work remarkable WBC 26.6, Hgb 6.0, plt 262, Na 121, K 2.5, bicarb 19, Cr 5, AST/ALT 144/40, Alk Phos 241, T bili > 50, INR 1.5, ammonia 91, lipase 28. Acute hepatitis panel was negative. EtOH level unable to obtain due to unidentified substance. EKG showed sinus tachycardia withotu signs of acute ischemia. US abdomen concerning for possible cholecystitis.   Past Medical History  AUD OUD Depression  Schizophrenia  Thalassemia?   Significant Hospital Events   4/12 Admission  4/13 Transfer to the ICU   Consults:  GI, nephrology   Procedures:  None   Significant Diagnostic Tests:  4/13 US abdomen > IMPRESSION: 1. Enlarged echogenic liver consistent with steatosis and or hepatocellular disease. Splenomegaly. 2. Gallbladder wall thickening and small amount of pericholecystic fluid but no shadowing stones or evidence for sonographic Murphy. Findings are indeterminate for cholecystitis. Further evaluation with nuclear medicine hepatobiliary imaging could be evaluate for acute gallbladder disease. 3. Common bile duct slightly enlarged.   Correlate with LFTs.  4/13 CT abdomen/pelvis > IMPRESSION: 1. Hepatomegaly with profound hypodensity of hepatic parenchyma, consistent with severe hepatic steatosis and or acute hepatitis. 2.  Splenomegaly. 3.  Small volume ascites. 4.  Aortic Atherosclerosis (ICD10-I70.0).  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  Hepatic encephalopathy Coagulopathy  Alcoholic hepatitis  Alcohol use disorder  - GI Following - Acute insult: Alcohol vs ?cholecystitis ([place] abd with gallbladder wall thickening and pericholecystic fluid, high alk Phos and Tbili) vs Infection (leukocytosis but afebrile), etc  - Acute hepatitis panel negative, Acetaminophen < 10  - Follow up ANA, anti-smooth muscle Ab, ceruloplasmin, and IgG  - Continue Ceftriaxone for SBP Ppx - Continue MVI, folate, and thiamine   - He pulled his NGT, Place Cortrak - Continuet lactulose 30g QID, goal of 3-4 BMs per day - CIWA with Precedex and Ativan  - BlCx: NGTD - Discussed prognosis with family, he has been made DNR. No other life support measures (No Dialysis, No Pressors).  Will continue supportive care as above and below.  Acute renal failure with oliguria, suspect hepatorenal syndrome Hypokalemia  - Nephrology followin - Minimal UOP, FeNa <0.3% (Pre-renal) - Continue trial of albumin, octreotide, Midodrine per nephrology recs  - Strict I/Os and daily weights  - Continue to monitor electrolytes and replete PRN - After goals of care discussion with patient's father, will not pursue any HD, palliative has been consulted 4/13, will continue supportive measures, patient is not comfort care at  this time   Acute on chronic anemia  - History of thalassemia with baseline Hgb 9-10 - Hgb 6.8 this AM, s/p 2U pRBCs > 1U ordered by E-link- IV PPI  - No obvious source of bleeding, FOBT negative. Renal Failure vs  chronic illness vs alcohol use   - No history of varices in chart, no prior EGDs   - Daily CBCs  Hyponatremia secondary to SIADH - Fluid restriction < 1.5L  - Continue albumin as above  - Trend BMPs   ** POA is father, Arthur [name], [phone]). Also, Arthur [name] [phone]) is caregiver. Arthur [name] [phone]) is adoptive "sister" whose family took patient in about 15 years ago.   Family Communication: - 4/14 Family updated at bedside: Decision made to make patient DNR and Avoid life support measures of HD/Renal replacement nor Pressor support. Will continue supportive care as above and monitor.   Course -he developed Sudden resp distress requiring NRB with crackles on exam  ? Aspiration vs fluid overload versus epistaxis He was transition to full comfort with morphine drip and passed away few hours later  Cause of death-acute hypoxic respiratory failure , acute hepatic encephalopathy, AKI, alcoholic cirrhosis of liver with acute alcoholic hepatitis   V. Elsworth Soho MD

## 2021-09-09 IMAGING — CT CT ABD-PELV W/O CM
2 of 4 series · 15 of 46 positions shown, 17 images · non-contrast
Comparison: Same day abdominal ultrasound

CLINICAL DATA: Jaundice, distended abdomen, elevated LFTs

EXAM:
CT ABDOMEN AND PELVIS WITHOUT CONTRAST
TECHNIQUE: Multidetector CT imaging of the abdomen and pelvis was performed
following the standard protocol without IV contrast.

[Series 3: a/p w/o 5mm · axial · non-contrast · 0.86mm/px · z∈[+558,+1068]mm · 12 of 117 slices shown, 14 images]
[im 10/117  soft-tissue]
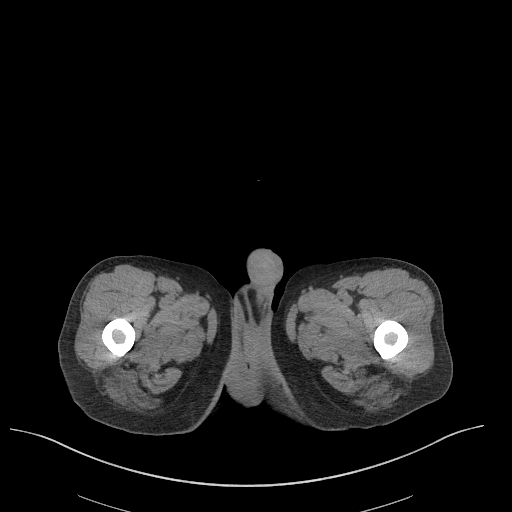
[im 10/117  bone]
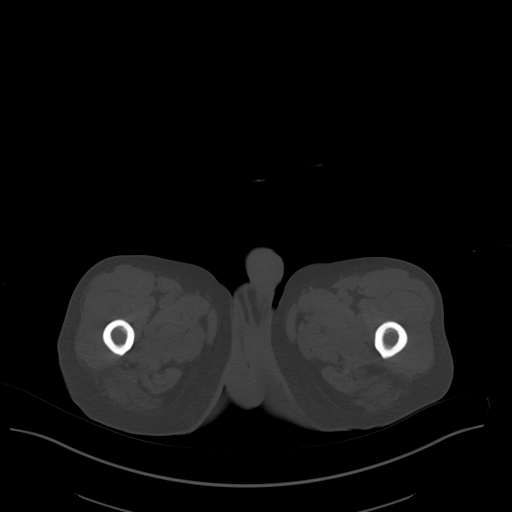
[im 19/117  soft-tissue]
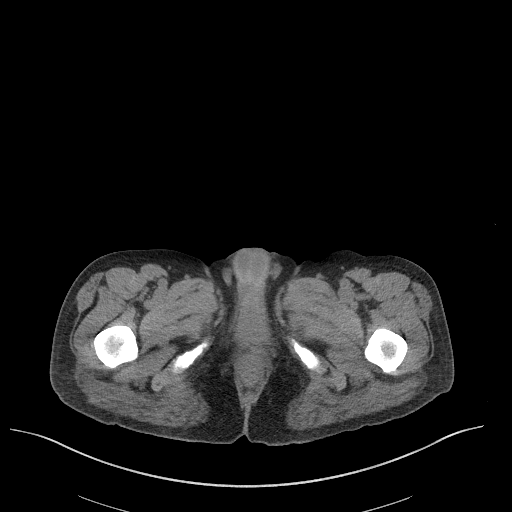
[im 28/117  soft-tissue]
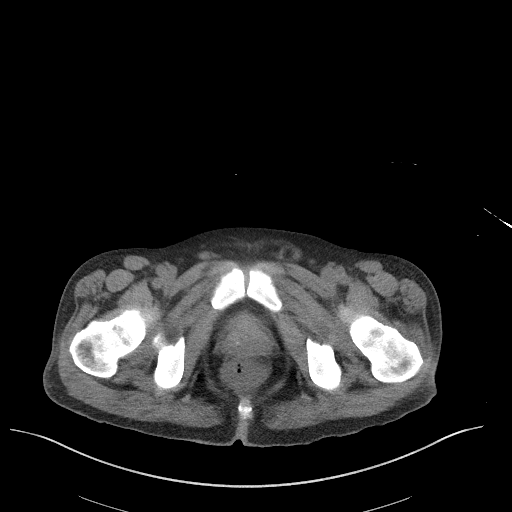
[im 38/117  soft-tissue]
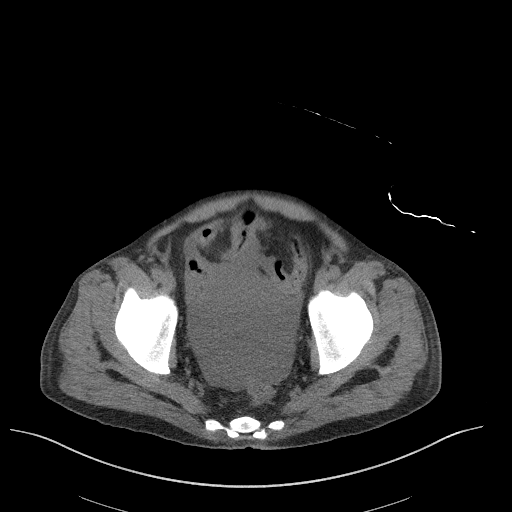
[im 47/117  soft-tissue]
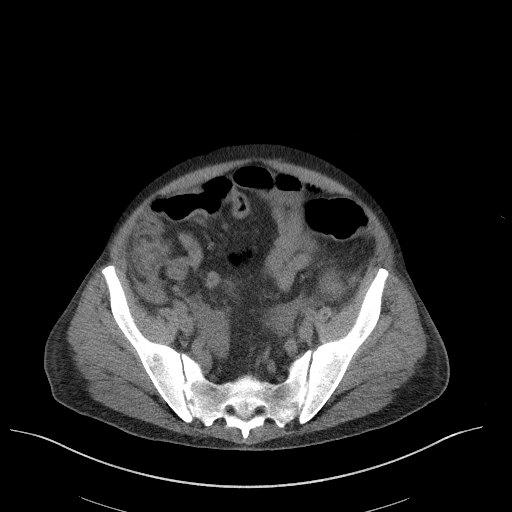
[im 56/117  soft-tissue]
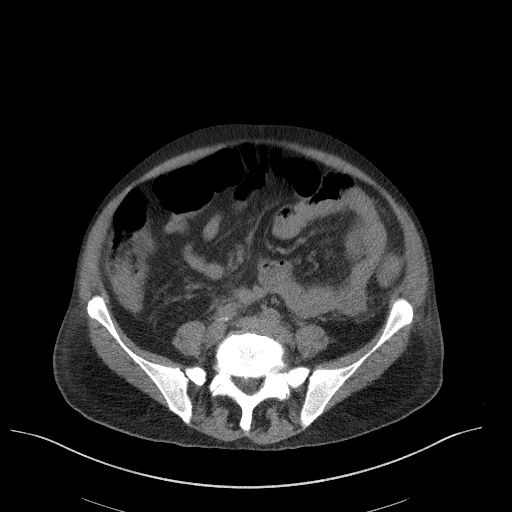
[im 65/117  soft-tissue]
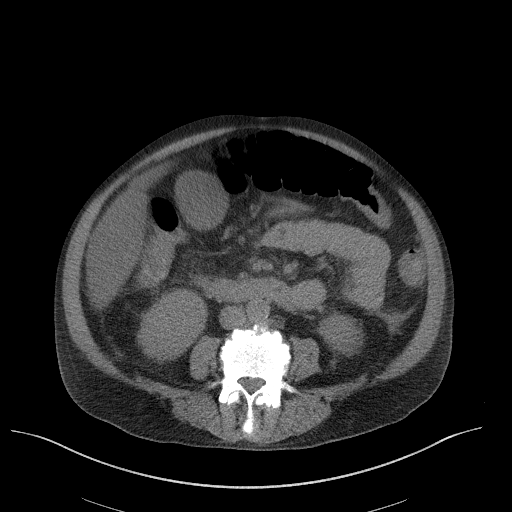
[im 75/117  soft-tissue]
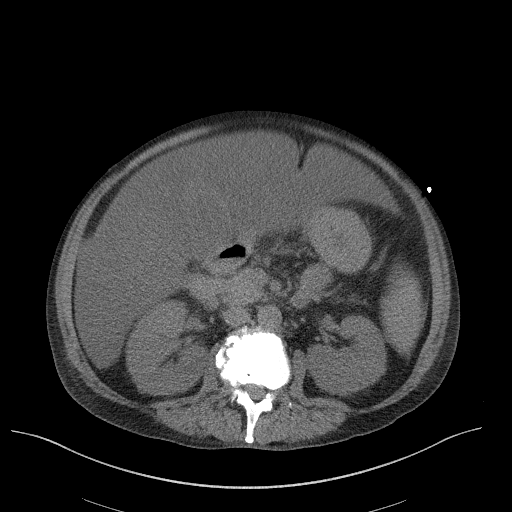
[im 84/117  soft-tissue]
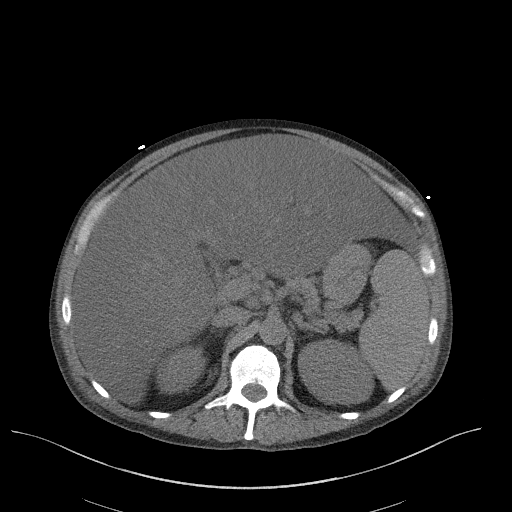
[im 84/117  bone]
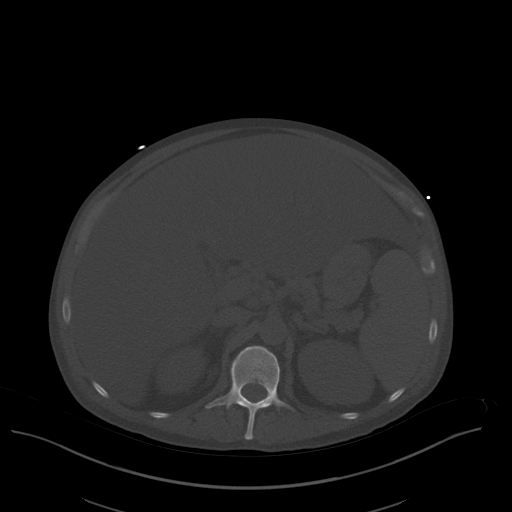
[im 93/117  soft-tissue]
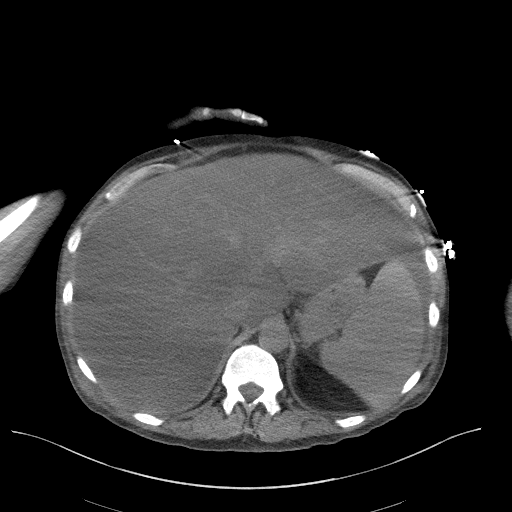
[im 103/117  soft-tissue]
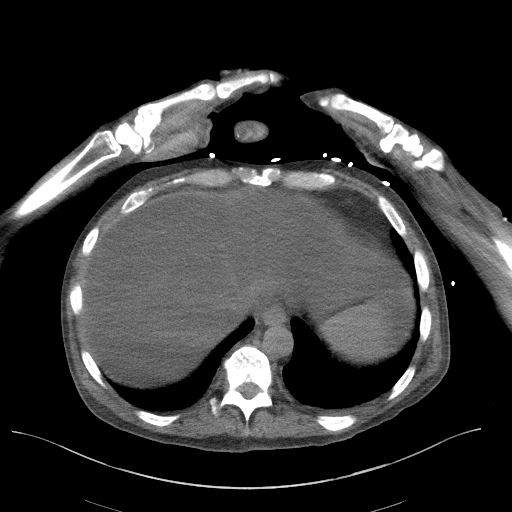
[im 112/117  soft-tissue]
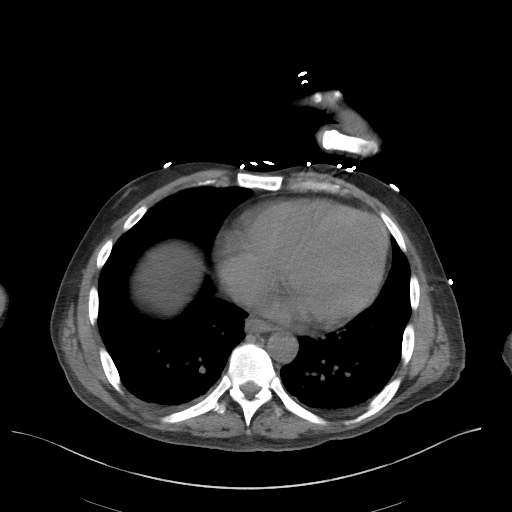

[Series 6: a/p w/o cor · coronal · non-contrast · 0.73mm/px · 3 of 155 slices shown]
[im 52/155  soft-tissue]
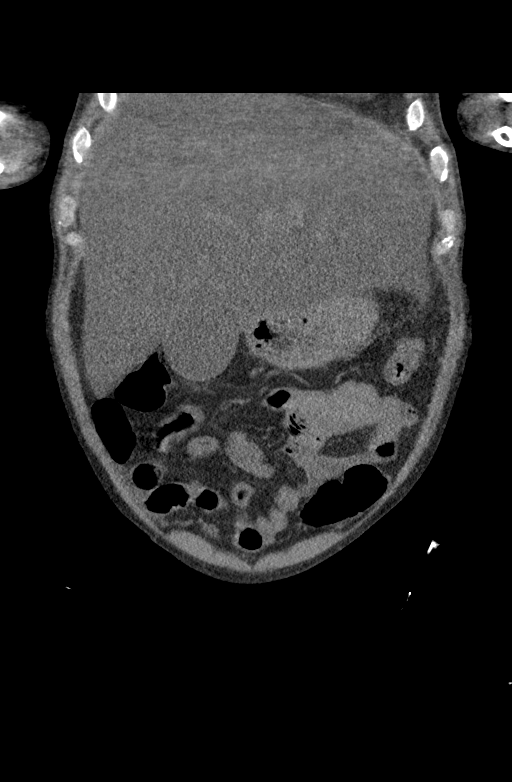
[im 69/155  soft-tissue]
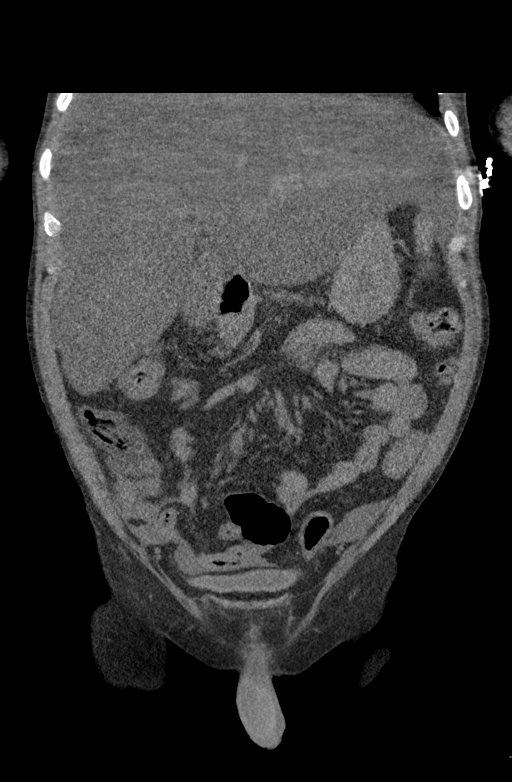
[im 86/155  soft-tissue]
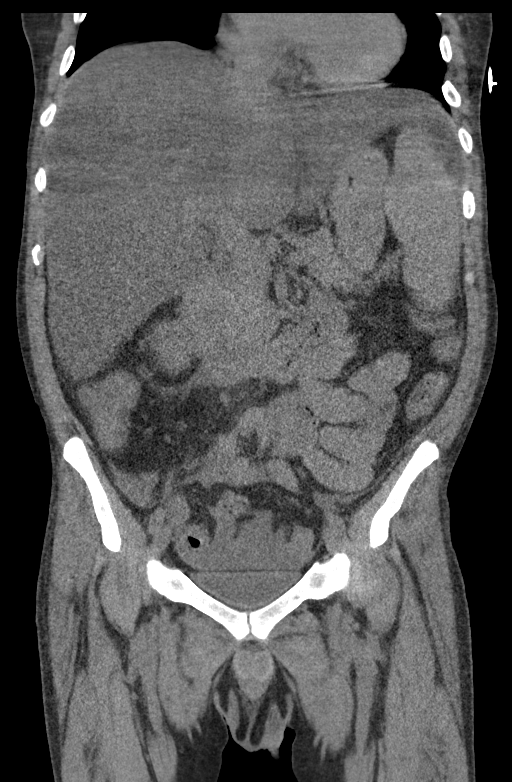

[15 of 46 positions shown; findings below may reference images not displayed]

FINDINGS: Lower chest: Trace bilateral pleural effusions.

Hepatobiliary: Hepatomegaly, maximum coronal span 25.6 cm, with
profound hypodensity of the hepatic parenchyma. No gallstones,
gallbladder wall thickening, or biliary dilatation.

Pancreas: Unremarkable. No pancreatic ductal dilatation or
surrounding inflammatory changes.

Spleen: Splenomegaly, maximum coronal span 16.8 cm.

Adrenals/Urinary Tract: Adrenal glands are unremarkable. Kidneys are
normal, without renal calculi, solid lesion, or hydronephrosis.
Bladder is unremarkable.

Stomach/Bowel: Stomach is within normal limits. Appendix is not
clearly visualized. No evidence of bowel wall thickening,
distention, or inflammatory changes.

Vascular/Lymphatic: Aortic atherosclerosis. No enlarged abdominal or
pelvic lymph nodes.

Reproductive: No mass or other significant abnormality.

Other: No abdominal wall hernia or abnormality. Small volume
ascites.

Musculoskeletal: No acute or significant osseous findings.
IMPRESSION: 1. Hepatomegaly with profound hypodensity of hepatic parenchyma,
consistent with severe hepatic steatosis and or acute hepatitis.

2.  Splenomegaly.

3.  Small volume ascites.

4.  Aortic Atherosclerosis ([id number]).

## 2021-09-09 IMAGING — US IR ABDOMEN US LIMITED
1 series · 4 of 4 positions shown · non-contrast
Comparison: Abdominal ultrasound 09/05/2019

CLINICAL DATA: 48-year-old with history of abdominal pain. Evaluate
for ascites.

EXAM:
LIMITED ABDOMEN ULTRASOUND FOR ASCITES
TECHNIQUE: Limited ultrasound survey for ascites was performed in all four
abdominal quadrants.

[Series 1: ir (id) (id)/(id)/(id) ir · 4 of 4 slices shown]
[im 1/4]
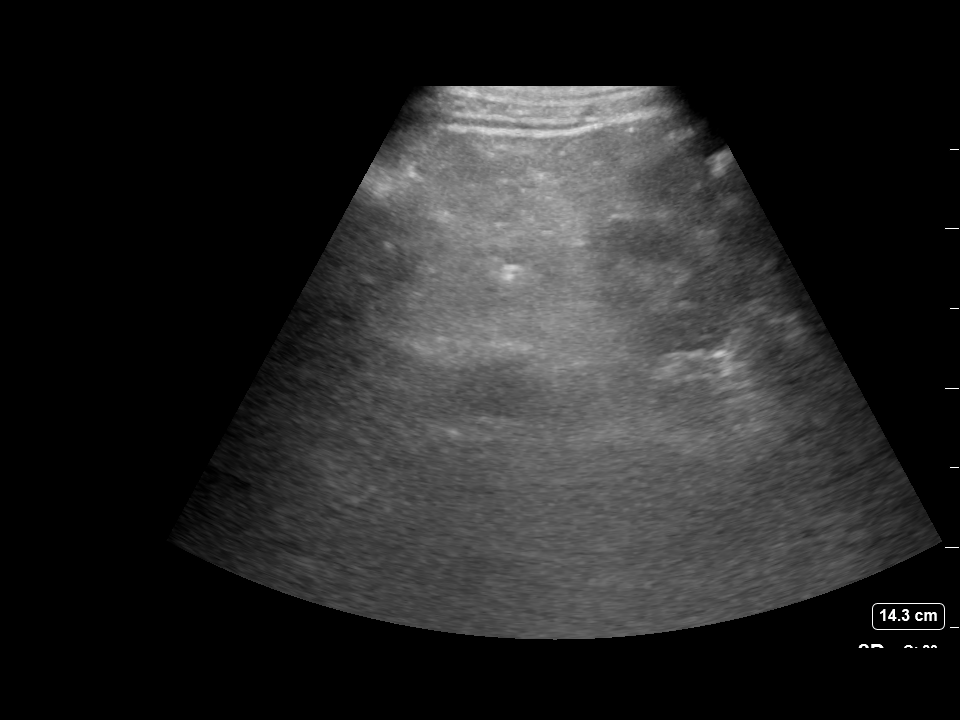
[im 2/4]
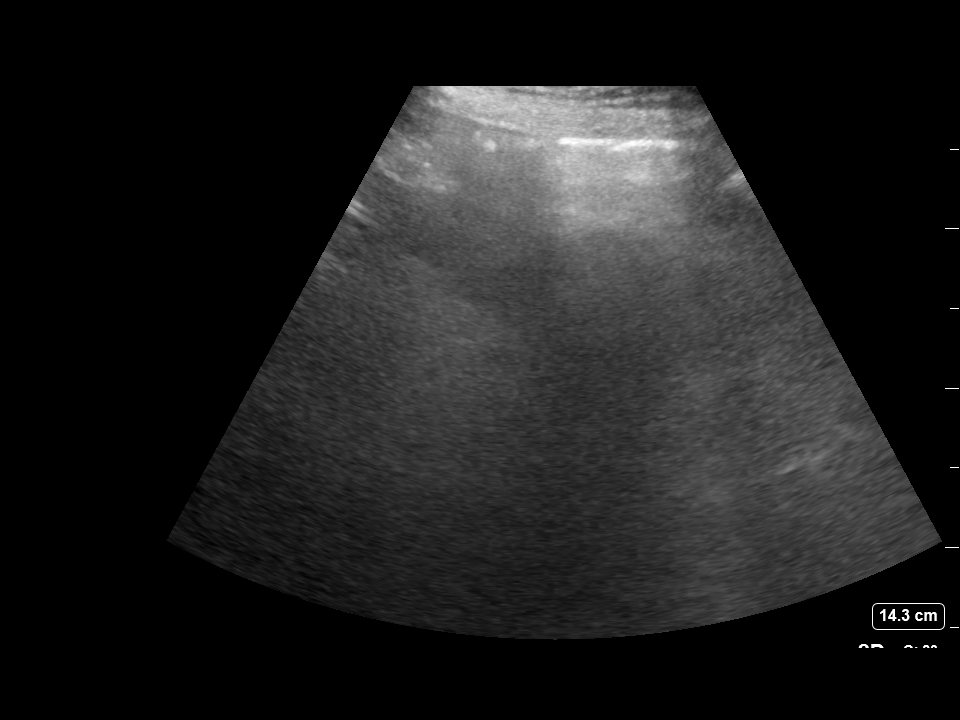
[im 3/4]
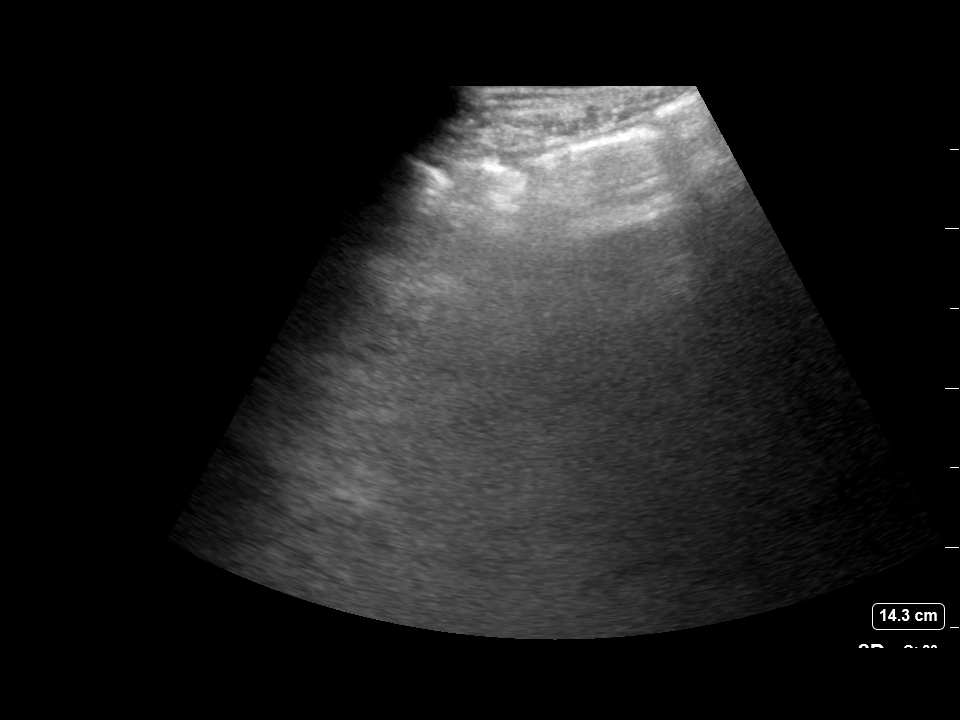
[im 4/4]
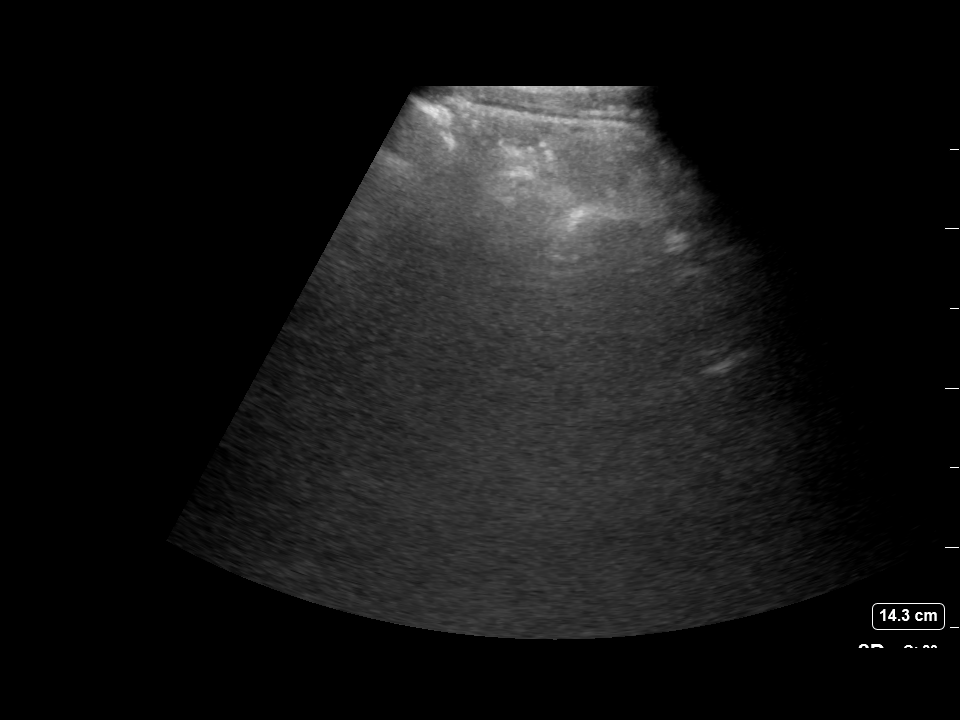

[4 of 4 positions shown; findings below may reference images not displayed]

FINDINGS: Negative for ascites in the 4 quadrants of the abdomen.
IMPRESSION: Negative for ascites.
